# Patient Record
Sex: Male | Born: 1945 | Race: Black or African American | Hispanic: No | Marital: Single | Smoking: Never smoker
Health system: Southern US, Community
[De-identification: ages and names within clinical notes are randomized; demographics above are authoritative.]

## PROBLEM LIST (undated history)

## (undated) DIAGNOSIS — E785 Hyperlipidemia, unspecified: Secondary | ICD-10-CM

## (undated) DIAGNOSIS — K219 Gastro-esophageal reflux disease without esophagitis: Secondary | ICD-10-CM

## (undated) DIAGNOSIS — F329 Major depressive disorder, single episode, unspecified: Secondary | ICD-10-CM

## (undated) DIAGNOSIS — I10 Essential (primary) hypertension: Secondary | ICD-10-CM

## (undated) HISTORY — PX: GASTRECTOMY: SHX58

---

## 2015-04-02 ENCOUNTER — Emergency Department: Payer: Medicare Other

## 2015-04-02 ENCOUNTER — Inpatient Hospital Stay
Admission: EM | Admit: 2015-04-02 | Discharge: 2015-04-04 | DRG: 871 | Disposition: A | Discharge status: Other Acute Inpatient Hospital | Payer: Medicare Other | Attending: Internal Medicine | Admitting: Internal Medicine

## 2015-04-02 DIAGNOSIS — N433 Hydrocele, unspecified: Secondary | ICD-10-CM | POA: Diagnosis present

## 2015-04-02 DIAGNOSIS — F17210 Nicotine dependence, cigarettes, uncomplicated: Secondary | ICD-10-CM | POA: Diagnosis present

## 2015-04-02 DIAGNOSIS — R000 Tachycardia, unspecified: Secondary | ICD-10-CM | POA: Diagnosis present

## 2015-04-02 DIAGNOSIS — G0490 Encephalitis and encephalomyelitis, unspecified: Secondary | ICD-10-CM | POA: Diagnosis present

## 2015-04-02 DIAGNOSIS — I330 Acute and subacute infective endocarditis: Secondary | ICD-10-CM | POA: Diagnosis not present

## 2015-04-02 DIAGNOSIS — F1210 Cannabis abuse, uncomplicated: Secondary | ICD-10-CM | POA: Diagnosis present

## 2015-04-02 DIAGNOSIS — I10 Essential (primary) hypertension: Secondary | ICD-10-CM

## 2015-04-02 DIAGNOSIS — I4510 Unspecified right bundle-branch block: Secondary | ICD-10-CM | POA: Diagnosis present

## 2015-04-02 DIAGNOSIS — R509 Fever, unspecified: Secondary | ICD-10-CM | POA: Diagnosis present

## 2015-04-02 DIAGNOSIS — R4182 Altered mental status, unspecified: Secondary | ICD-10-CM

## 2015-04-02 DIAGNOSIS — R918 Other nonspecific abnormal finding of lung field: Secondary | ICD-10-CM | POA: Diagnosis present

## 2015-04-02 DIAGNOSIS — R739 Hyperglycemia, unspecified: Secondary | ICD-10-CM | POA: Diagnosis present

## 2015-04-02 DIAGNOSIS — G009 Bacterial meningitis, unspecified: Secondary | ICD-10-CM | POA: Diagnosis not present

## 2015-04-02 DIAGNOSIS — I248 Other forms of acute ischemic heart disease: Secondary | ICD-10-CM | POA: Diagnosis present

## 2015-04-02 DIAGNOSIS — I214 Non-ST elevation (NSTEMI) myocardial infarction: Secondary | ICD-10-CM

## 2015-04-02 DIAGNOSIS — A419 Sepsis, unspecified organism: Principal | ICD-10-CM | POA: Diagnosis present

## 2015-04-02 DIAGNOSIS — I639 Cerebral infarction, unspecified: Secondary | ICD-10-CM

## 2015-04-02 DIAGNOSIS — R740 Nonspecific elevation of levels of transaminase and lactic acid dehydrogenase [LDH]: Secondary | ICD-10-CM | POA: Diagnosis present

## 2015-04-02 LAB — ETHANOL: Alcohol, Ethyl (B): <5 mg/dL (ref <5)

## 2015-04-02 LAB — CSF CELL COUNT WITH DIFFERENTIAL
Lymphs, CSF: 6 % — ABNORMAL LOW (ref 40–80)
Lymphs, CSF: 9 % — ABNORMAL LOW (ref 40–80)
Monocyte-Macrophage-Spinal Fluid: 5 % — ABNORMAL LOW (ref 15–45)
Monocyte-Macrophage-Spinal Fluid: 8 % — ABNORMAL LOW (ref 15–45)
RBC COUNT CSF: 830 /mm3 — AB
RBC Count, CSF: 234 /mm3 — ABNORMAL HIGH
SEGMENTED NEUTROPHILS-CSF: 86 % — AB (ref 0–6)
Segmented Neutrophils-CSF: 86 % — ABNORMAL HIGH (ref 0–6)
TUBE #: 1
TUBE #: 1
WBC, CSF: 4541 /mm3 (ref 0–5)
WBC, CSF: 5693 /mm3 (ref 0–5)

## 2015-04-02 LAB — COMPREHENSIVE METABOLIC PANEL
ALBUMIN: 3.4 g/dL — AB (ref 3.5–5.0)
ALK PHOS: 81 U/L (ref 38–126)
ALT: 40 U/L (ref 17–63)
AST: 202 U/L — AB (ref 15–41)
Anion gap: 13 (ref 5–15)
BILIRUBIN TOTAL: 0.5 mg/dL (ref 0.3–1.2)
BUN: 22 mg/dL — ABNORMAL HIGH (ref 6–20)
CO2: 25 mmol/L (ref 22–32)
Calcium: 9.5 mg/dL (ref 8.9–10.3)
Chloride: 100 mmol/L — ABNORMAL LOW (ref 101–111)
Creatinine, Ser: 1.2 mg/dL (ref 0.61–1.24)
GFR calc Af Amer: >60 mL/min (ref >60)
GFR calc non Af Amer: 60 mL/min — ABNORMAL LOW (ref >60)
Glucose, Bld: 162 mg/dL — ABNORMAL HIGH (ref 65–99)
POTASSIUM: 3.6 mmol/L (ref 3.5–5.1)
Sodium: 138 mmol/L (ref 135–145)
Total Protein: 9.2 g/dL — ABNORMAL HIGH (ref 6.5–8.1)

## 2015-04-02 LAB — CBC WITH DIFFERENTIAL/PLATELET
Basophils Absolute: 0 10*3/uL (ref 0–0.1)
Basophils Relative: 0 %
Eosinophils Absolute: 0 10*3/uL (ref 0–0.7)
Eosinophils Relative: 0 %
HCT: 43.6 % (ref 40.0–52.0)
Hemoglobin: 14.3 g/dL (ref 13.0–18.0)
LYMPHS ABS: 0.9 10*3/uL — AB (ref 1.0–3.6)
Lymphocytes Relative: 3 %
MCH: 29 pg (ref 26.0–34.0)
MCHC: 32.7 g/dL (ref 32.0–36.0)
MCV: 88.8 fL (ref 80.0–100.0)
Monocytes Absolute: 1.5 10*3/uL — ABNORMAL HIGH (ref 0.2–1.0)
Monocytes Relative: 6 %
NEUTROS ABS: 23.8 10*3/uL — AB (ref 1.4–6.5)
NEUTROS PCT: 91 %
PLATELETS: 281 10*3/uL (ref 150–440)
RBC: 4.91 MIL/uL (ref 4.40–5.90)
RDW: 14.9 % — ABNORMAL HIGH (ref 11.5–14.5)
WBC: 26.2 10*3/uL — ABNORMAL HIGH (ref 3.8–10.6)

## 2015-04-02 LAB — RAPID HIV SCREEN (HIV 1/2 AB+AG)
HIV 1/2 ANTIBODIES: NONREACTIVE
HIV-1 P24 ANTIGEN - HIV24: NONREACTIVE

## 2015-04-02 LAB — URINE DRUG SCREEN, QUALITATIVE (ARMC ONLY)
AMPHETAMINES, UR SCREEN: NOT DETECTED
BARBITURATES, UR SCREEN: NOT DETECTED
BENZODIAZEPINE, UR SCRN: NOT DETECTED
Cannabinoid 50 Ng, Ur ~~LOC~~: POSITIVE — AB
Cocaine Metabolite,Ur ~~LOC~~: NOT DETECTED
MDMA (Ecstasy)Ur Screen: NOT DETECTED
Methadone Scn, Ur: NOT DETECTED
OPIATE, UR SCREEN: NOT DETECTED
Phencyclidine (PCP) Ur S: NOT DETECTED
Tricyclic, Ur Screen: NOT DETECTED

## 2015-04-02 LAB — TROPONIN I
Troponin I: 0.28 ng/mL — ABNORMAL HIGH (ref <0.031)
Troponin I: 0.36 ng/mL — ABNORMAL HIGH (ref <0.031)
Troponin I: 0.52 ng/mL — ABNORMAL HIGH (ref <0.031)

## 2015-04-02 LAB — MRSA PCR SCREENING: MRSA by PCR: NEGATIVE

## 2015-04-02 LAB — URINALYSIS COMPLETE WITH MICROSCOPIC (ARMC ONLY)
Bacteria, UA: NONE SEEN
Bilirubin Urine: NEGATIVE
Glucose, UA: 50 mg/dL — AB
Leukocytes, UA: NEGATIVE
Nitrite: NEGATIVE
Specific Gravity, Urine: 1.025 (ref 1.005–1.030)
pH: 5 (ref 5.0–8.0)

## 2015-04-02 LAB — PROTIME-INR
INR: 1.2
Prothrombin Time: 15.4 seconds — ABNORMAL HIGH (ref 11.4–15.0)

## 2015-04-02 LAB — SEDIMENTATION RATE: Sed Rate: 28 mm/hr — ABNORMAL HIGH (ref 0–20)

## 2015-04-02 LAB — LACTIC ACID, PLASMA: LACTIC ACID, VENOUS: 1.6 mmol/L (ref 0.5–2.0)

## 2015-04-02 LAB — GLUCOSE, CAPILLARY
Glucose-Capillary: 162 mg/dL — ABNORMAL HIGH (ref 65–99)
Glucose-Capillary: 203 mg/dL — ABNORMAL HIGH (ref 65–99)

## 2015-04-02 LAB — PROTEIN, CSF: Total  Protein, CSF: 269 mg/dL — ABNORMAL HIGH (ref 15–45)

## 2015-04-02 LAB — GLUCOSE, CSF: Glucose, CSF: <20 mg/dL — CL (ref 40–70)

## 2015-04-02 LAB — PHOSPHORUS: Phosphorus: 2.3 mg/dL — ABNORMAL LOW (ref 2.5–4.6)

## 2015-04-02 LAB — ACETAMINOPHEN LEVEL: Acetaminophen (Tylenol), Serum: <10 ug/mL — ABNORMAL LOW (ref 10–30)

## 2015-04-02 LAB — SALICYLATE LEVEL

## 2015-04-02 LAB — TSH: TSH: 1.793 u[IU]/mL (ref 0.350–4.500)

## 2015-04-02 LAB — LACTATE DEHYDROGENASE: LDH: 604 U/L — ABNORMAL HIGH (ref 98–192)

## 2015-04-02 LAB — MAGNESIUM: Magnesium: 2.2 mg/dL (ref 1.7–2.4)

## 2015-04-02 MED ORDER — HYDRALAZINE HCL 20 MG/ML IJ SOLN: 10.0000 mg | INTRAMUSCULAR | Status: DC | PRN

## 2015-04-02 MED ORDER — DILTIAZEM HCL 25 MG/5ML IV SOLN
INTRAVENOUS | Status: AC
  Administered 2015-04-02: 10 mg via INTRAVENOUS
  Filled 2015-04-02: qty 5

## 2015-04-02 MED ORDER — ACETAMINOPHEN 325 MG PO TABS: 650.0000 mg | ORAL_TABLET | Freq: Four times a day (QID) | ORAL | Status: DC | PRN

## 2015-04-02 MED ORDER — FOLIC ACID 5 MG/ML IJ SOLN
1.0000 mg | Freq: Every day | INTRAMUSCULAR | Status: DC
Start: 2015-04-02 — End: 2015-04-04
  Administered 2015-04-02 – 2015-04-04 (×3): 1 mg via INTRAVENOUS
  Filled 2015-04-02 (×4): qty 0.2

## 2015-04-02 MED ORDER — NICARDIPINE HCL IN NACL 20-0.86 MG/200ML-% IV SOLN
3.0000 mg/h | Freq: Once | INTRAVENOUS | Status: AC
  Administered 2015-04-02: 5 mg/h via INTRAVENOUS
  Filled 2015-04-02: qty 200

## 2015-04-02 MED ORDER — DILTIAZEM HCL 100 MG IV SOLR
5.0000 mg/h | Freq: Once | INTRAVENOUS | Status: AC
  Administered 2015-04-02: 5 mg/h via INTRAVENOUS
  Filled 2015-04-02: qty 100

## 2015-04-02 MED ORDER — DILTIAZEM HCL 25 MG/5ML IV SOLN
10.0000 mg | Freq: Once | INTRAVENOUS | Status: AC
Start: 2015-04-02 — End: 2015-04-02
  Administered 2015-04-02: 10 mg via INTRAVENOUS

## 2015-04-02 MED ORDER — VANCOMYCIN HCL IN DEXTROSE 1-5 GM/200ML-% IV SOLN
1000.0000 mg | Freq: Two times a day (BID) | INTRAVENOUS | Status: DC
  Administered 2015-04-03 – 2015-04-04 (×2): 1000 mg via INTRAVENOUS
  Filled 2015-04-02 (×4): qty 200

## 2015-04-02 MED ORDER — PNEUMOCOCCAL VAC POLYVALENT 25 MCG/0.5ML IJ INJ
0.5000 mL | INJECTION | INTRAMUSCULAR | Status: AC
  Administered 2015-04-03: 0.5 mL via INTRAMUSCULAR
  Filled 2015-04-02: qty 0.5

## 2015-04-02 MED ORDER — THIAMINE HCL 100 MG/ML IJ SOLN
100.0000 mg | Freq: Every day | INTRAMUSCULAR | Status: DC
  Administered 2015-04-02 – 2015-04-04 (×3): 100 mg via INTRAVENOUS
  Filled 2015-04-02 (×3): qty 2

## 2015-04-02 MED ORDER — SODIUM CHLORIDE 0.9 % IV BOLUS (SEPSIS)
1000.0000 mL | Freq: Once | INTRAVENOUS | Status: AC
  Administered 2015-04-02: 1000 mL via INTRAVENOUS

## 2015-04-02 MED ORDER — ACETAMINOPHEN 650 MG RE SUPP
RECTAL | Status: AC
  Filled 2015-04-02: qty 1

## 2015-04-02 MED ORDER — HYDRALAZINE HCL 20 MG/ML IJ SOLN
10.0000 mg | Freq: Once | INTRAMUSCULAR | Status: AC
  Administered 2015-04-02: 10 mg via INTRAVENOUS
  Filled 2015-04-02: qty 1

## 2015-04-02 MED ORDER — ASPIRIN 300 MG RE SUPP
300.0000 mg | Freq: Once | RECTAL | Status: AC
  Administered 2015-04-02: 300 mg via RECTAL
  Filled 2015-04-02: qty 1

## 2015-04-02 MED ORDER — DEXTROSE 5 % IV SOLN
10.0000 mg/kg | Freq: Once | INTRAVENOUS | Status: AC
  Administered 2015-04-02: 860 mg via INTRAVENOUS
  Filled 2015-04-02: qty 17.2

## 2015-04-02 MED ORDER — HEPARIN SODIUM (PORCINE) 5000 UNIT/ML IJ SOLN
5000.0000 [IU] | Freq: Three times a day (TID) | INTRAMUSCULAR | Status: DC
  Administered 2015-04-02 (×2): 5000 [IU] via SUBCUTANEOUS
  Filled 2015-04-02 (×2): qty 1

## 2015-04-02 MED ORDER — DEXTROSE 5 % IV SOLN
2.0000 g | Freq: Three times a day (TID) | INTRAVENOUS | Status: DC
  Administered 2015-04-02 – 2015-04-04 (×7): 2 g via INTRAVENOUS
  Filled 2015-04-02 (×10): qty 2

## 2015-04-02 MED ORDER — LORAZEPAM 2 MG/ML IJ SOLN
INTRAMUSCULAR | Status: AC
  Filled 2015-04-02: qty 1

## 2015-04-02 MED ORDER — LORAZEPAM 2 MG/ML IJ SOLN
INTRAMUSCULAR | Status: AC
  Administered 2015-04-02: 1 mg via INTRAVENOUS
  Filled 2015-04-02: qty 1

## 2015-04-02 MED ORDER — HEPARIN (PORCINE) IN NACL 100-0.45 UNIT/ML-% IJ SOLN
1350.0000 [IU]/h | INTRAMUSCULAR | Status: DC
  Administered 2015-04-02: 1100 [IU]/h via INTRAVENOUS
  Filled 2015-04-02 (×4): qty 250

## 2015-04-02 MED ORDER — VANCOMYCIN HCL IN DEXTROSE 1-5 GM/200ML-% IV SOLN
1000.0000 mg | Freq: Once | INTRAVENOUS | Status: AC
  Administered 2015-04-02: 1000 mg via INTRAVENOUS
  Filled 2015-04-02: qty 200

## 2015-04-02 MED ORDER — LORAZEPAM 2 MG/ML IJ SOLN
1.0000 mg | Freq: Once | INTRAMUSCULAR | Status: AC
  Administered 2015-04-02: 1 mg via INTRAVENOUS

## 2015-04-02 MED ORDER — DEXTROSE 5 % IV SOLN
850.0000 mg | Freq: Three times a day (TID) | INTRAVENOUS | Status: DC
  Administered 2015-04-02 – 2015-04-04 (×6): 850 mg via INTRAVENOUS
  Filled 2015-04-02 (×10): qty 17

## 2015-04-02 MED ORDER — ACETAMINOPHEN 650 MG RE SUPP
650.0000 mg | Freq: Four times a day (QID) | RECTAL | Status: DC | PRN
  Administered 2015-04-03: 650 mg via RECTAL
  Filled 2015-04-02: qty 1

## 2015-04-02 MED ORDER — IOHEXOL 300 MG/ML  SOLN
100.0000 mL | Freq: Once | INTRAMUSCULAR | Status: AC | PRN
  Administered 2015-04-02: 100 mL via INTRAVENOUS

## 2015-04-02 MED ORDER — NICARDIPINE HCL IN NACL 20-0.86 MG/200ML-% IV SOLN
3.0000 mg/h | INTRAVENOUS | Status: DC
  Administered 2015-04-02: 15 mg/h via INTRAVENOUS
  Administered 2015-04-02: 7.5 mg/h via INTRAVENOUS
  Administered 2015-04-02 (×2): 15 mg/h via INTRAVENOUS
  Administered 2015-04-03 (×3): 8 mg/h via INTRAVENOUS
  Administered 2015-04-03: 6 mg/h via INTRAVENOUS
  Administered 2015-04-03 (×3): 8 mg/h via INTRAVENOUS
  Administered 2015-04-03: 10 mg/h via INTRAVENOUS
  Administered 2015-04-04: 3 mg/h via INTRAVENOUS
  Filled 2015-04-02 (×14): qty 200

## 2015-04-02 MED ORDER — ACETAMINOPHEN 650 MG RE SUPP
650.0000 mg | Freq: Once | RECTAL | Status: AC
  Administered 2015-04-02: 650 mg via RECTAL

## 2015-04-02 MED ORDER — VANCOMYCIN HCL IN DEXTROSE 1-5 GM/200ML-% IV SOLN
1000.0000 mg | Freq: Two times a day (BID) | INTRAVENOUS | Status: DC
  Filled 2015-04-02: qty 200

## 2015-04-02 MED ORDER — ADENOSINE 6 MG/2ML IV SOLN
INTRAVENOUS | Status: AC
  Administered 2015-04-02: 6 mg
  Filled 2015-04-02: qty 2

## 2015-04-02 MED ORDER — SODIUM CHLORIDE 0.9 % IV BOLUS (SEPSIS)
1580.0000 mL | Freq: Once | INTRAVENOUS | Status: AC
  Administered 2015-04-02: 1580 mL via INTRAVENOUS

## 2015-04-02 NOTE — ED Notes (Signed)
Pt via EMS found by brother this am with AMS. Last time known well last Tuesday. Usually A&Ox4 per EMS report. Pt uncooperative at this time. Agitated. Edd Fabian MD at bedside.

## 2015-04-02 NOTE — ED Provider Notes (Signed)
[facility] Emergency Department Provider Note  ____________________________________________  Time seen: Approximately 14:45 AM  I have reviewed the triage vital signs and the nursing notes.   HISTORY  Chief Complaint Altered Mental Status  Caveat-history of present illness and review of systems Limited secondary to the patient's altered mental status. All history of present illness and review of systems obtained from EMS on their arrival.  HPI Nathan Thomas is a 69 y.o. male with unknown medical history presents via EMS for altered mental status. According to the patient's family, they last saw him well 4 days ago. Today, his brother found him altered at his home. On EMS arrival, he was severely hypertensive but vital signs were otherwise within normal limits. No other history is provided.    History reviewed. No pertinent past medical history.  Patient Active Problem List   Diagnosis Date Noted  . Fever 04/02/2015    History reviewed. No pertinent past surgical history.  No current outpatient prescriptions on file.  Allergies Review of patient's allergies indicates no known allergies.  History reviewed. No pertinent family history.  Social History History  Substance Use Topics  . Smoking status: Not on file  . Smokeless tobacco: Not on file  . Alcohol Use: Not on file    Review of Systems   Caveat-history of present illness and review of systems Limited secondary to the patient's altered mental status. All history of present illness and review of systems obtained from EMS on their arrival. ____________________________________________   PHYSICAL EXAM:  VITAL SIGNS: ED Triage Vitals  Enc Vitals Group     BP 04/02/15 1052 198/101 mmHg     Pulse Rate 04/02/15 1052 91     Resp 04/02/15 1052 24     Temp 04/02/15 1052 102.1 F (38.9 C)     Temp Source 04/02/15 1052 Rectal     SpO2 04/02/15 1052 93 %     Weight 04/02/15 1052 190 lb (86.183  kg)     Height 04/02/15 1052 5\' 11"  (1.803 m)     Head Cir --      Peak Flow --      Pain Score --      Pain Loc --      Pain Edu? --      Excl. in [place]? --     Constitutional: The patient is awake, severely agitated, moving all of his extremities vigorously, fighting the examiner Eyes: Conjunctivae are normal. PERRL. EOMI. Head: Atraumatic. Nose: No congestion/rhinnorhea. Mouth/Throat: Mucous membranes are dry.  Oropharynx non-erythematous. Neck: No stridor.  Cardiovascular: Normal rate, regular rhythm. Grossly normal heart sounds.  Good peripheral circulation. Respiratory: Normal respiratory effort.  No retractions. Lungs CTAB. Gastrointestinal: Soft and nontender. No distention. No abdominal bruits. No CVA tenderness. Genitourinary: large scrotal swelling without perceived tenderness Musculoskeletal: No lower extremity tenderness nor edema.  No joint effusions. Neurologic: Eyes open spontaneously, mumbles incoherently, moves all extremities equally. Unable to cooperate with formal neurological examination. Skin:  Skin is warm, dry and intact. No rash noted. Psychiatric: Unable to assess secondary to altered mental status.  ____________________________________________   LABS (all labs ordered are listed, but only abnormal results are displayed)  Labs Reviewed  CBC WITH DIFFERENTIAL/PLATELET - Abnormal; Notable for the following:    WBC 26.2 (*)    RDW 14.9 (*)    Neutro Abs 23.8 (*)    Lymphs Abs 0.9 (*)    Monocytes Absolute 1.5 (*)    All other components within normal  limits  COMPREHENSIVE METABOLIC PANEL - Abnormal; Notable for the following:    Chloride 100 (*)    Glucose, Bld 162 (*)    BUN 22 (*)    Total Protein 9.2 (*)    Albumin 3.4 (*)    AST 202 (*)    GFR calc non Af Amer 60 (*)    All other components within normal limits  TROPONIN I - Abnormal; Notable for the following:    Troponin I 0.28 (*)    All other components within normal limits  URINALYSIS  COMPLETEWITH MICROSCOPIC ([facility] ONLY) - Abnormal; Notable for the following:    Color, Urine YELLOW (*)    APPearance CLEAR (*)    Glucose, UA 50 (*)    Ketones, ur TRACE (*)    Hgb urine dipstick 3+ (*)    Protein, ur >500 (*)    Squamous Epithelial / LPF 0-5 (*)    All other components within normal limits  URINE DRUG SCREEN, QUALITATIVE ([facility] ONLY) - Abnormal; Notable for the following:    Cannabinoid 50 Ng, Ur [place] POSITIVE (*)    All other components within normal limits  ACETAMINOPHEN LEVEL - Abnormal; Notable for the following:    Acetaminophen (Tylenol), Serum <10 (*)    All other components within normal limits  CULTURE, BLOOD (ROUTINE X 2)  CULTURE, BLOOD (ROUTINE X 2)  LACTIC ACID, PLASMA  ETHANOL  SALICYLATE LEVEL  LACTIC ACID, PLASMA   ____________________________________________  EKG  ED ECG REPORT I, Joanne Gavel, the attending physician, personally viewed and interpreted this ECG.   Date: 04/02/2015  EKG Time: 10:50  Rate: 75  Rhythm: normal sinus rhythm  Axis: normal  Intervals:right bundle branch block  ST&T Change: No acute ST segment elevation.  ____________________________________________  RADIOLOGY  CT head IMPRESSION: 1. No acute intracranial abnormalities. 2. Age related volume loss. 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    CXR IMPRESSION: 1. 4.2 cm masslike opacity in the right mid lung. This is concerning for malignancy. 2. Coarse reticular and patchy airspace opacity noted in the right upper lobe. This could reflect infection. 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 3. Recommend follow-up chest CT with contrast.  CT chest/abdomen and pelvis IMPRESSION: 1. 4 cm right upper lobe mass highly suspicious for a primary bronchogenic carcinoma. Irregular mass like opacity seen adjacent to this in the right upper lobe which may reflect conglomerate scarring, additional tumor or combination of both. Other areas of right upper lobe opacity  are consistent with chronic scarring. There is an emphysematous bleb at the right apex. 2. No evidence of hilar or mediastinal metastatic disease or of left lung metastatic disease. 3. In the abdomen pelvis, the bladder wall appears thickened. Consider cystitis if there are consistent clinical symptoms. This could be a chronic finding in this patient. 4. No other evidence of an acute abnormality below the diaphragm. 5. 4 mm low-density lesion in the central liver. This may reflect a small cyst. Metastatic disease is not excluded but felt unlikely. Adjacent lower attenuation liver lesion appears to contain some fat. No other evidence to suggest metastatic disease within the abdomen or pelvis. ADDENDUM: Patient also has a distended scrotum which appears to be due to large bilateral hydroceles. This could be further assessed, if desired clinically, with scrotal ultrasound. No scrotal air is seen to suggest a gas-forming infection.  ____________________________________________   PROCEDURES  Procedure(s) performed: None  Critical Care performed: Yes, see critical care note(s). Total critical care time spent  50 minutes.  ____________________________________________   INITIAL IMPRESSION / ASSESSMENT AND PLAN / ED COURSE  Pertinent labs & imaging results that were available during my care of the patient were reviewed by me and considered in my medical decision making (see chart for details).  Nathan Thomas is a 69 y.o. male with unknown medical history presents via EMS for altered mental status. On arrival to the emergency department, he was agitated, attempting to get out of bed, fighting the examiner which improved significantly with 1 mg of Ativan. He is febrile, tachypneic meeting 2 out of 4 Sirs criteria. Plan for labs, CT head, chest x-ray, urinalysis, CT of the pelvis to rule out Fournier's gangrene. We'll treat with liberal IV fluids, IV vancomycin, IV Ceftazidime. Anticipate  admission.  ----------------------------------------- 3:15 PM on 04/02/2015 ----------------------------------------- Labs reviewed by me and are notable for severe leukocytosis, troponin elevation. Aspirin ordered. urine drug screen positive for cannabis. CT head negative. CT of the chest abdomen pelvis performed, notable for concerning pulmonary lesion which may represent malignancy. Scrotum with bilateral hydroceles, no evidence of Fournier's gangrene. No obvious source for the patient's fever/altered mental status at this time. I attempted lumbar puncture without success. Discussed with Dr. Sanda Linger of interventional radiology and he will help to arrange lumbar puncture under fluoroscopy. We will give IV acyclovir as well. Case discussed with the hospitalist, Dr. Volanda Napoleon, at this time for admission. Blood pressure with only transient response to hydralazine which I have given him twice. We'll start nicardipine drip.  ____________________________________________   FINAL CLINICAL IMPRESSION(S) / ED DIAGNOSES  Final diagnoses:  Altered mental status  Sepsis, due to unspecified organism  Malignant hypertension  NSTEMI (non-ST elevated myocardial infarction)      Joanne Gavel, MD 04/02/15 1554

## 2015-04-02 NOTE — Progress Notes (Signed)
Called by nursing staff for patient with tachycardia, evaluated patient at bedside Heart rate in the 160s appears narrow complex tachycardia (sinus tachycardia) Of note patient is now on maximum dose of nicardipine  Likely nicardipine-induced tachycardia  The 20-25% reduction original blood pressure place close systolic pressure around 585, can wean nicardipine to this new goal blood pressure and monitor effect heart rate, if required can use hydralazine to augment blood pressure control

## 2015-04-02 NOTE — Progress Notes (Signed)
ANTIBIOTIC CONSULT NOTE - INITIAL  Pharmacy Consult for Acyclovir and Vancomycin Dosing  Indication: Fever/Leukocytosis/Possible Meningitis  No Known Allergies  Patient Measurements: Height: 5\' 11"  (180.3 cm) Weight: 190 lb (86.183 kg) IBW/kg (Calculated) : 75.3 Adjusted Body Weight: 80kg  Vital Signs: Temp: 99.4 F (37.4 C) (07/16 1730) Temp Source: Rectal (07/16 1118) BP: 179/98 mmHg (07/16 1740) Pulse Rate: 103 (07/16 1740) Intake/Output from previous day:   Intake/Output from this shift: Total I/O In: -  Out: 1200 [Urine:1200]  Labs:  Recent Labs  04/02/15 1105  WBC 26.2*  HGB 14.3  PLT 281  CREATININE 1.20   Estimated Creatinine Clearance: 61.9 mL/min (by C-G formula based on Cr of 1.2). No results for input(s): VANCOTROUGH, VANCOPEAK, VANCORANDOM, GENTTROUGH, GENTPEAK, GENTRANDOM, TOBRATROUGH, TOBRAPEAK, TOBRARND, AMIKACINPEAK, AMIKACINTROU, AMIKACIN in the last 72 hours.   Microbiology: No results found for this or any previous visit (from the past 720 hour(s)).  Medical History: History reviewed. No pertinent past medical history.  Medications:  Scheduled:  . acyclovir  850 mg Intravenous 3 times per day  . cefTAZidime (FORTAZ)  IV  2 g Intravenous 3 times per day  . folic acid  1 mg Intravenous Daily  . heparin  5,000 Units Subcutaneous 3 times per day  . thiamine  100 mg Intravenous Daily  . [START ON 04/03/2015] vancomycin  1,000 mg Intravenous Q12H   Infusions:  . niCARDipine 12.5 mg/hr (04/02/15 1802)   Assessment: Pharmacy consulted to dose vancomycin and acyclovir for 69 yo male being treated for possible meningitis, admitted with fever and leukocytosis. Patient was found unresponsive. Patient is ordered ceftazadime 2g IV Q8hr and received vancomycin and acyclovir in ED on 7/16.    Goal of Therapy:  Vancomycin trough level 15-20 mcg/ml  Plan:   1. Acyclovir: Will continue patient on acyclovir 850mg (~10mg /kg) IV Q8hr.   2. Vancomycin:  Will continue patient on vancomycin 1g IV q12hr for goal trough of 15-20. Will start next dose at 1830. Will obtain trough prior to am dose on 7/18.    Pharmacy will continue to monitor and adjust per consult.   Simpson,Michael L 04/02/2015,6:03 PM

## 2015-04-02 NOTE — H&P (Signed)
[facility] [place] NAME: Nathan Thomas    MR#:  [id number]  DATE OF BIRTH:  November 26, 1945  DATE OF ADMISSION:  04/02/2015  PRIMARY CARE PHYSICIAN: No PCP Per Patient   REQUESTING/REFERRING PHYSICIAN: Dr Edd Fabian  CHIEF COMPLAINT:   Chief Complaint  Patient presents with  . Altered Mental Status    HISTORY OF PRESENT ILLNESS:  Nathan Thomas  is a 69 y.o. male with no significant past medical history who presented to the emergency room this morning unresponsive. Per the EMS report he was found by his brother unresponsive at his home. Last known well last Tuesday, 5 days ago. At baseline he is alert and oriented 4. When brought in by EMS he was disoriented and uncooperative. At the time of my examination he has had 2 mg of Ativan and is minimally responsive he is febrile at 102, with leukocytosis at 26,000 and hypertensive with a blood pressure of 210/94.  PAST MEDICAL HISTORY:  History reviewed. No pertinent past medical history. no known past medical history, unable to obtain from patient due to altered mental status. Per EMS report he does not get better and treat himself with homeopathic remedies.  PAST SURGICAL HISTORY:  History reviewed. No pertinent past surgical history. unknown  SOCIAL HISTORY:   History  Substance Use Topics  . Smoking status: Not on file  . Smokeless tobacco: Not on file  . Alcohol Use: Not on file   Unknown. I have attempted to call his brother, no answer.  FAMILY HISTORY:  History reviewed. No pertinent family history.   Unknown, unable to obtain due to patient's altered mental status  DRUG ALLERGIES:  No Known Allergies  REVIEW OF SYSTEMS:   ROS   Unable to obtain due to patient's altered mental status  MEDICATIONS AT HOME:   Prior to Admission medications   Not on File      VITAL SIGNS:  Blood pressure 210/94, pulse 78, temperature 102.2 F (39 C), temperature source Rectal, resp.  rate 25, height $RemoveBe'5\' 11"'rggHblJCQ$  (1.803 m), weight 86.183 kg (190 lb), SpO2 97 %.  PHYSICAL EXAMINATION:  GENERAL:  69 y.o.-year-old patient lying in the bed, head turned to the left, minimally responsive, no acute distress EYES: Pupils equal, round, reactive to light and accommodation. No scleral icterus. Eyes track together, mainly fixed in a downward gaze HEENT: Head atraumatic, normocephalic. Will not open his mouth, jaw as forcefully closed  NECK:  Supple, no jugular venous distention. No thyroid enlargement, no tenderness.  LUNGS: Normal breath sounds bilaterally, no wheezing, rales, rhonchi or crepitation. No use of accessory muscles of respiration.  CARDIOVASCULAR: S1, S2 normal. No murmurs, rubs, or gallops.  ABDOMEN: Soft, nontender, nondistended. Bowel sounds present. No organomegaly or mass. No guarding no rebound EXTREMITIES: No pedal edema, cyanosis, or clubbing. Pedal pulses are 2+, good capillary refill, NEUROLOGIC: Does not participate in neurologic examination, moves both upper extremities spontaneously, eyes do track together that are mainly fixed in a downward gaze, moves his head spontaneously back and forth, withdraws from pain in all 4 extremities PSYCHIATRIC: Unable to assess due to altered mental status SKIN: No obvious rash, lesion, or ulcer.   LABORATORY PANEL:   CBC  Recent Labs Lab 04/02/15 1105  WBC 26.2*  HGB 14.3  HCT 43.6  PLT 281   ------------------------------------------------------------------------------------------------------------------  Chemistries   Recent Labs Lab 04/02/15 1105  NA 138  K 3.6  CL 100*  CO2 25  GLUCOSE 162*  BUN 22*  CREATININE 1.20  CALCIUM 9.5  AST 202*  ALT 40  ALKPHOS 81  BILITOT 0.5   ------------------------------------------------------------------------------------------------------------------  Cardiac Enzymes  Recent Labs Lab 04/02/15 1105  TROPONINI 0.28*    ------------------------------------------------------------------------------------------------------------------  RADIOLOGY:  Ct Head Wo Contrast  04/02/2015   CLINICAL DATA:  Pt via EMS found by brother this am with AMS. Last time known well last Tuesday. Usually A&Ox4 per EMS report. Pt uncooperative at this time. Agitated.  EXAM: CT HEAD WITHOUT CONTRAST  TECHNIQUE: Contiguous axial images were obtained from the base of the skull through the vertex without intravenous contrast.  COMPARISON:  None  FINDINGS: Ventricles are normal in configuration. There is mild, age related, ventricular and sulcal enlargement. No hydrocephalus.  There are no parenchymal masses or mass effect. There is no evidence of a recent infarct. Small area of hypoattenuation the left cerebellum consistent with an old infarct.  There are no extra-axial masses or abnormal fluid collections.  There is no intracranial hemorrhage.  Visualized sinuses and mastoid air cells are clear. No skull fracture or lesion.  IMPRESSION: 1. No acute intracranial abnormalities. 2. Age related volume loss.  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   Electronically Signed   By: Lajean Manes M.D.   On: 04/02/2015 11:55   Ct Chest W Contrast  04/02/2015   ADDENDUM REPORT: 04/02/2015 14:25  ADDENDUM: Patient also has a distended scrotum which appears to be due to large bilateral hydroceles. This could be further assessed, if desired clinically, with scrotal ultrasound. No scrotal air is seen to suggest a gas-forming infection.   Electronically Signed   By: Lajean Manes M.D.   On: 04/02/2015 14:25   04/02/2015   CLINICAL DATA:  Pt via EMS found by brother this am with AMS. Last time known well last Tuesday. Usually A&Ox4 per EMS report. Pt with abnormal chest x-ray from today and scrotal swelling noted on physical exam. Pt unable to answer any med hx questions  EXAM: CT CHEST, ABDOMEN, AND PELVIS WITH CONTRAST  TECHNIQUE: Multidetector CT imaging of the  chest, abdomen and pelvis was performed following the standard protocol during bolus administration of intravenous contrast.  CONTRAST:  141mL OMNIPAQUE IOHEXOL 300 MG/ML  SOLN  COMPARISON:  Current chest radiograph  FINDINGS: CT CHEST FINDINGS  Lungs and pleura: 4 cm mass in the right upper lobe correlates with the oval masslike opacity noted on the current chest radiograph. There is also a right upper lobe irregular patchy opacity in coarse reticular opacity. The areas of contiguous regular patchy opacity, which extend from the mass to the superior lateral right upper lobe pleural margin, may reflect additional tumor, conglomerate scarring or a combination. There also cystic areas and bronchiectasis with a prominent bleb at the right apex. Much of the coarse reticular opacities are likely due scarring. There are changes of mild centrilobular emphysema in both lungs mostly in the upper lobes. There is some dependent subsegmental atelectasis in both lower lobes. No left lung mass or suspicious nodule. No pleural effusions or pneumothorax.  Thoracic inlet: Mildly enlarged heterogeneous thyroid gland. No discrete measurable nodule. No neck base masses or adenopathy.  Mediastinum and hila: No masses or pathologically enlarged lymph nodes. Subcarinal node measures 1 cm short axis. There are several sub cm peritracheal nodes. Heart is normal in size. Great vessels are normal in caliber.  CT ABDOMEN AND PELVIS FINDINGS  Liver: 4 mm low-density lesion centrally. 5 mm low-density lesion, suggesting a fatty lesion, is seen in  the left lobe. Focal fat projects along the inferior falciform ligament. No other liver abnormalities.  Spleen, gallbladder, pancreas:  Unremarkable.  Adrenal glands. Left adrenal gland is thickened, measuring 17 mm in greatest transverse thickness. Normal right adrenal gland.  Kidneys, ureters, bladder: 5 mm low-density lesion from the lower pole left kidney consistent with a cyst. No other renal  masses, no stones and no hydronephrosis. Normal ureters. Bladder is decompressed around a Foley catheter. Despite being collapse, the wall still appears thickened. No bladder mass.  Lymph nodes:  No enlarged or abnormal appearing lymph nodes.  Ascites: None.  Gastrointestinal: No bowel wall thickening or inflammatory changes. No bowel dilation is seen to suggest obstruction or adynamic ileus. No appendicitis.  MUSCULOSKELETAL  No osteoblastic or osteolytic lesions.  IMPRESSION: 1. 4 cm right upper lobe mass highly suspicious for a primary bronchogenic carcinoma. Irregular mass like opacity seen adjacent to this in the right upper lobe which may reflect conglomerate scarring, additional tumor or combination of both. Other areas of right upper lobe opacity are consistent with chronic scarring. There is an emphysematous bleb at the right apex. 2. No evidence of hilar or mediastinal metastatic disease or of left lung metastatic disease. 3. In the abdomen pelvis, the bladder wall appears thickened. Consider cystitis if there are consistent clinical symptoms. This could be a chronic finding in this patient. 4. No other evidence of an acute abnormality below the diaphragm. 5. 4 mm low-density lesion in the central liver. This may reflect a small cyst. Metastatic disease is not excluded but felt unlikely. Adjacent lower attenuation liver lesion appears to contain some fat. No other evidence to suggest metastatic disease within the abdomen or pelvis.  Electronically Signed: By: Lajean Manes M.D. On: 04/02/2015 13:44   Ct Abdomen Pelvis W Contrast  04/02/2015   ADDENDUM REPORT: 04/02/2015 14:25  ADDENDUM: Patient also has a distended scrotum which appears to be due to large bilateral hydroceles. This could be further assessed, if desired clinically, with scrotal ultrasound. No scrotal air is seen to suggest a gas-forming infection.   Electronically Signed   By: Lajean Manes M.D.   On: 04/02/2015 14:25   04/02/2015    CLINICAL DATA:  Pt via EMS found by brother this am with AMS. Last time known well last Tuesday. Usually A&Ox4 per EMS report. Pt with abnormal chest x-ray from today and scrotal swelling noted on physical exam. Pt unable to answer any med hx questions  EXAM: CT CHEST, ABDOMEN, AND PELVIS WITH CONTRAST  TECHNIQUE: Multidetector CT imaging of the chest, abdomen and pelvis was performed following the standard protocol during bolus administration of intravenous contrast.  CONTRAST:  18mL OMNIPAQUE IOHEXOL 300 MG/ML  SOLN  COMPARISON:  Current chest radiograph  FINDINGS: CT CHEST FINDINGS  Lungs and pleura: 4 cm mass in the right upper lobe correlates with the oval masslike opacity noted on the current chest radiograph. There is also a right upper lobe irregular patchy opacity in coarse reticular opacity. The areas of contiguous regular patchy opacity, which extend from the mass to the superior lateral right upper lobe pleural margin, may reflect additional tumor, conglomerate scarring or a combination. There also cystic areas and bronchiectasis with a prominent bleb at the right apex. Much of the coarse reticular opacities are likely due scarring. There are changes of mild centrilobular emphysema in both lungs mostly in the upper lobes. There is some dependent subsegmental atelectasis in both lower lobes. No left lung mass or suspicious nodule.  No pleural effusions or pneumothorax.  Thoracic inlet: Mildly enlarged heterogeneous thyroid gland. No discrete measurable nodule. No neck base masses or adenopathy.  Mediastinum and hila: No masses or pathologically enlarged lymph nodes. Subcarinal node measures 1 cm short axis. There are several sub cm peritracheal nodes. Heart is normal in size. Great vessels are normal in caliber.  CT ABDOMEN AND PELVIS FINDINGS  Liver: 4 mm low-density lesion centrally. 5 mm low-density lesion, suggesting a fatty lesion, is seen in the left lobe. Focal fat projects along the inferior  falciform ligament. No other liver abnormalities.  Spleen, gallbladder, pancreas:  Unremarkable.  Adrenal glands. Left adrenal gland is thickened, measuring 17 mm in greatest transverse thickness. Normal right adrenal gland.  Kidneys, ureters, bladder: 5 mm low-density lesion from the lower pole left kidney consistent with a cyst. No other renal masses, no stones and no hydronephrosis. Normal ureters. Bladder is decompressed around a Foley catheter. Despite being collapse, the wall still appears thickened. No bladder mass.  Lymph nodes:  No enlarged or abnormal appearing lymph nodes.  Ascites: None.  Gastrointestinal: No bowel wall thickening or inflammatory changes. No bowel dilation is seen to suggest obstruction or adynamic ileus. No appendicitis.  MUSCULOSKELETAL  No osteoblastic or osteolytic lesions.  IMPRESSION: 1. 4 cm right upper lobe mass highly suspicious for a primary bronchogenic carcinoma. Irregular mass like opacity seen adjacent to this in the right upper lobe which may reflect conglomerate scarring, additional tumor or combination of both. Other areas of right upper lobe opacity are consistent with chronic scarring. There is an emphysematous bleb at the right apex. 2. No evidence of hilar or mediastinal metastatic disease or of left lung metastatic disease. 3. In the abdomen pelvis, the bladder wall appears thickened. Consider cystitis if there are consistent clinical symptoms. This could be a chronic finding in this patient. 4. No other evidence of an acute abnormality below the diaphragm. 5. 4 mm low-density lesion in the central liver. This may reflect a small cyst. Metastatic disease is not excluded but felt unlikely. Adjacent lower attenuation liver lesion appears to contain some fat. No other evidence to suggest metastatic disease within the abdomen or pelvis.  Electronically Signed: By: Lajean Manes M.D. On: 04/02/2015 13:44   Dg Chest Portable 1 View  04/02/2015   CLINICAL DATA:  Pt  brought in today by EMS for AMS and agitated pt with some shob.  EXAM: PORTABLE CHEST - 1 VIEW  COMPARISON:  None.  FINDINGS: 4.2 cm round opacity projects in the right mid lung. There is more heterogeneous airspace and coarse reticular type opacity above this in the right upper lobe. The right apex there is an emphysematous bleb. Left lung is clear.  Cardiac silhouette is normal in size. Normal mediastinal and left hilar contours. Right hilum is partly obscured by contiguous right upper lobe interstitial type opacities. No obvious hilar mass.  No convincing pleural effusion and no pneumothorax.  Bony thorax is intact.  IMPRESSION: 1. 4.2 cm masslike opacity in the right mid lung. This is concerning for malignancy. 2. Coarse reticular and patchy airspace opacity noted in the right upper lobe. This could reflect infection. 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 3. Recommend follow-up chest CT with contrast.   Electronically Signed   By: Lajean Manes M.D.   On: 04/02/2015 12:04    EKG:   Orders placed or performed during the hospital encounter of 04/02/15  . ED EKG  . ED EKG    IMPRESSION  AND PLAN:   69 year old male with no known past medical history not on any prescribed medications found with altered mental status at home this morning. Last known normal 5 days ago. Currently febrile at 102, leukocytosis at 26K, hypertensive, minimally responsive.  #1 fever and leukocytosis: Urine and chest x-ray negative for signs of infection. CT abdomen unremarkable. Blood cultures pending. No signs of skin infection. LP will be performed in the emergency room. He has been started on acyclovir, Fortaz and vancomycin. Will check HIV, hepatitis panel, RMSF, ESR. Lactic acid is 1.6. Start Airborne Precautions in Case of Meningitis.  # 2 malignant hypertension: Start Cardene drip and admitted to stepdown unit. CT of the head is negative for acute intracranial process. Malignant hypertension could cause hypertensive  encephalopathy.  #3 elevated troponin: 0.28 We'll continue to cycle enzymes, expect that this will decrease as blood pressure is controlled. Likely strain due to hypertension. EKG with no signs of ischemia. If persistently elevated will start heparin drip. Will obtain 2-D echocardiogram.  #4 hyperglycemia: Check hemoglobin A1c and monitor CBGs, no coverage. Not acidotic  #5 transaminitis: AST at 202 ALT 40 albumin decreased at 3.4. Check acute hepatitis panel. Ethanol and acetaminophen levels level normal.  #6 urine drug screen positive for cannabinoids. Question use of synthetic marijuana products which can result in altered mental status.  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 No sign of skin infection. No significant discomfort or signs of pain. 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  #8 4 cm right upper lobe lung mass suspicious for primary bronchogenic carcinoma, 4 mm low-density lesion in the liver: Fever and altered mental status possibly a neuroendocrine effect from malignancy. [facility] consult pulmonology for possible bronchoscopy.  All the records are reviewed and case discussed with ED provider. Management plans discussed with the patient, family and they are in agreement.  CODE STATUS: full  TOTAL TIME TAKING CARE OF THIS PATIENT: 55 minutes.  Greater than 50% of time spent in coordination of care and counseling.  Myrtis Ser M.D on 04/02/2015 at 3:40 PM  Between 7am to 6pm - Pager - [phone]  After 6pm go to [email or web address] - password [email or web address]  [facility]  Office  [phone]  CC: Primary care physician; No PCP Per Patient

## 2015-04-02 NOTE — ED Notes (Signed)
Pt transported to the floor via stretcher with RN and NA. Pt remained on the monitor for transport.

## 2015-04-02 NOTE — Progress Notes (Signed)
ANTICOAGULATION CONSULT NOTE - Initial Consult  Pharmacy Consult for Heparin Drip Management Indication: chest pain/ACS  No Known Allergies  Patient Measurements: Height: 5\' 11"  (180.3 cm) Weight: 190 lb (86.183 kg) IBW/kg (Calculated) : 75.3   Vital Signs: Temp: 98.8 F (37.1 C) (07/16 2000) Temp Source: Rectal (07/16 1118) BP: 141/88 mmHg (07/16 2010) Pulse Rate: 111 (07/16 2010)  Labs:  Recent Labs  04/02/15 1105 04/02/15 1754  HGB 14.3  --   HCT 43.6  --   PLT 281  --   LABPROT 15.4*  --   INR 1.20  --   CREATININE 1.20  --   TROPONINI 0.28* 0.36*    Estimated Creatinine Clearance: 61.9 mL/min (by C-G formula based on Cr of 1.2).   Medical History: History reviewed. No pertinent past medical history.  Medications:  Scheduled:  . acyclovir  850 mg Intravenous 3 times per day  . cefTAZidime (FORTAZ)  IV  2 g Intravenous 3 times per day  . folic acid  1 mg Intravenous Daily  . [START ON 04/03/2015] pneumococcal 23 valent vaccine  0.5 mL Intramuscular Tomorrow-1000  . thiamine  100 mg Intravenous Daily  . [START ON 04/03/2015] vancomycin  1,000 mg Intravenous Q12H   Infusions:  . heparin    . niCARDipine 15 mg/hr (04/02/15 2111)    Assessment: Pharmacy consulted to initiate heparin drip for 69 yo male ICU patient being treated for possible meningitis and now with elevated troponins x 2. Patient previously ordered heparin 5000 units SQ TID. Patient received last dose at 2100 on 7/16.    Goal of Therapy:  Heparin level 0.3-0.7 units/ml Monitor platelets by anticoagulation protocol: Yes   Plan:  As patient received SQ heparin 30 minutes ago, will forgo heparin bolus.  Start heparin infusion at 1100 units/hr Check anti-Xa level in 6 hours and daily while on heparin Continue to monitor H&H and platelets  Mckayla Mulcahey L 04/02/2015,9:26 PM

## 2015-04-02 NOTE — ED Notes (Signed)
Pt returned to room at this time. Report called to Trula Slade in the icu at this time. All questions answered. Pt will be transferred soon.

## 2015-04-02 NOTE — Procedures (Signed)
Interventional Radiology Procedure Note  Procedure: L2-L3 lumbar puncture under fluoro.  Opening pressure grossly elevated at 57 cm H2O.  CSF is cloudy highly concerning for bacterial meningitis.  A total of 18 mL fluid was removed and sent to the lab.   Complications: None  Estimated Blood Loss: 0  Recommendations: - Cx pending - Consider bacterial meningitis precautions - Bedrest x 2 hrs  Signed,  Criselda Peaches, MD

## 2015-04-03 ENCOUNTER — Inpatient Hospital Stay
Admit: 2015-04-03 | Discharge: 2015-04-03 | Disposition: A | Discharge status: Home / Self Care | Payer: Medicare Other | Attending: Internal Medicine | Admitting: Internal Medicine

## 2015-04-03 DIAGNOSIS — A419 Sepsis, unspecified organism: Secondary | ICD-10-CM | POA: Diagnosis not present

## 2015-04-03 LAB — URINALYSIS COMPLETE WITH MICROSCOPIC (ARMC ONLY)
BILIRUBIN URINE: NEGATIVE
Glucose, UA: 50 mg/dL — AB
KETONES UR: NEGATIVE mg/dL
LEUKOCYTES UA: NEGATIVE
Nitrite: NEGATIVE
PROTEIN: 100 mg/dL — AB
Specific Gravity, Urine: 1.023 (ref 1.005–1.030)
pH: 6 (ref 5.0–8.0)

## 2015-04-03 LAB — COMPREHENSIVE METABOLIC PANEL
ALBUMIN: 2.9 g/dL — AB (ref 3.5–5.0)
ALT: 35 U/L (ref 17–63)
ANION GAP: 13 (ref 5–15)
AST: 93 U/L — ABNORMAL HIGH (ref 15–41)
Alkaline Phosphatase: 70 U/L (ref 38–126)
BILIRUBIN TOTAL: 0.6 mg/dL (ref 0.3–1.2)
BUN: 25 mg/dL — ABNORMAL HIGH (ref 6–20)
CHLORIDE: 103 mmol/L (ref 101–111)
CO2: 23 mmol/L (ref 22–32)
Calcium: 8.8 mg/dL — ABNORMAL LOW (ref 8.9–10.3)
Creatinine, Ser: 1.18 mg/dL (ref 0.61–1.24)
GFR calc Af Amer: >60 mL/min (ref >60)
GFR calc non Af Amer: >60 mL/min (ref >60)
Glucose, Bld: 162 mg/dL — ABNORMAL HIGH (ref 65–99)
POTASSIUM: 3.1 mmol/L — AB (ref 3.5–5.1)
Sodium: 139 mmol/L (ref 135–145)
Total Protein: 8.1 g/dL (ref 6.5–8.1)

## 2015-04-03 LAB — CBC
HEMATOCRIT: 44.9 % (ref 40.0–52.0)
Hemoglobin: 14.4 g/dL (ref 13.0–18.0)
MCH: 29.2 pg (ref 26.0–34.0)
MCHC: 32.1 g/dL (ref 32.0–36.0)
MCV: 91 fL (ref 80.0–100.0)
Platelets: 275 10*3/uL (ref 150–440)
RBC: 4.94 MIL/uL (ref 4.40–5.90)
RDW: 14.8 % — AB (ref 11.5–14.5)
WBC: 29.9 10*3/uL — AB (ref 3.8–10.6)

## 2015-04-03 LAB — GLUCOSE, CAPILLARY
GLUCOSE-CAPILLARY: 158 mg/dL — AB (ref 65–99)
GLUCOSE-CAPILLARY: 142 mg/dL — AB (ref 65–99)
GLUCOSE-CAPILLARY: 157 mg/dL — AB (ref 65–99)
Glucose-Capillary: 178 mg/dL — ABNORMAL HIGH (ref 65–99)
Glucose-Capillary: 158 mg/dL — ABNORMAL HIGH (ref 65–99)

## 2015-04-03 LAB — HEMOGLOBIN A1C: HEMOGLOBIN A1C: 6.2 % — AB (ref 4.0–6.0)

## 2015-04-03 LAB — HEPARIN LEVEL (UNFRACTIONATED)
Heparin Unfractionated: 0.17 IU/mL — ABNORMAL LOW (ref 0.30–0.70)
Heparin Unfractionated: 0.34 IU/mL (ref 0.30–0.70)

## 2015-04-03 LAB — TROPONIN I: Troponin I: 0.56 ng/mL — ABNORMAL HIGH (ref <0.031)

## 2015-04-03 MED ORDER — POTASSIUM CHLORIDE 10 MEQ/100ML IV SOLN
10.0000 meq | INTRAVENOUS | Status: AC
  Administered 2015-04-03 (×2): 10 meq via INTRAVENOUS
  Filled 2015-04-03 (×2): qty 100

## 2015-04-03 MED ORDER — METOPROLOL TARTRATE 1 MG/ML IV SOLN
5.0000 mg | Freq: Four times a day (QID) | INTRAVENOUS | Status: DC
  Administered 2015-04-03 – 2015-04-04 (×6): 5 mg via INTRAVENOUS
  Filled 2015-04-03 (×6): qty 5

## 2015-04-03 MED ORDER — HEPARIN BOLUS VIA INFUSION
2600.0000 [IU] | Freq: Once | INTRAVENOUS | Status: AC
  Administered 2015-04-03: 2600 [IU] via INTRAVENOUS
  Filled 2015-04-03: qty 2600

## 2015-04-03 MED ORDER — ASPIRIN 300 MG RE SUPP
300.0000 mg | Freq: Every day | RECTAL | Status: DC
  Administered 2015-04-03 – 2015-04-04 (×2): 300 mg via RECTAL
  Filled 2015-04-03 (×2): qty 1

## 2015-04-03 MED ORDER — NITROGLYCERIN 2 % TD OINT
0.5000 [in_us] | TOPICAL_OINTMENT | Freq: Four times a day (QID) | TRANSDERMAL | Status: DC
  Administered 2015-04-03 – 2015-04-04 (×6): 0.5 [in_us] via TOPICAL
  Filled 2015-04-03 (×7): qty 1

## 2015-04-03 MED ORDER — SODIUM CHLORIDE 0.9 % IV SOLN
INTRAVENOUS | Status: DC
  Administered 2015-04-03 – 2015-04-04 (×2): via INTRAVENOUS

## 2015-04-03 NOTE — Progress Notes (Signed)
Date: 04/03/2015,   MRN# [id number] Nathan Thomas [birth year] Code Status:     Code Status Orders        Start     Ordered   04/02/15 1722  Full code   Continuous     04/02/15 1721     Hosp day:@LENGTHOFSTAYDAYS @ Referring MD: @ATDPROV @     PCP:      AdmissionWeight: 190 lb (86.183 kg)                 CurrentWeight: 190 lb (86.183 kg)   CC: lung mass  HPI: This is a 69 year old male, found unresponsive,  CSF leukocytosis. Being treated as meningitis/encophalitis, on w/u noted 4 cm RUL mass. Pulmonary consulted.   PMHX:   History reviewed. No pertinent past medical history. Surgical Hx:  History reviewed. No pertinent past surgical history. Family Hx:  History reviewed. No pertinent family history. Social Hx:   History  Substance Use Topics  . Smoking status: Current Every Day Smoker    Types: Cigarettes  . Smokeless tobacco: Not on file  . Alcohol Use: Not on file   Medication:    Home Medication:  No current outpatient prescriptions on file.  Current Medication: @CURMEDTAB @   Allergies:  Review of patient's allergies indicates no known allergies.  Review of Systems: Obtunded   Physical Examination:   VS: BP 149/82 mmHg  Pulse 114  Temp(Src) 101.2 F (38.4 C) (Axillary)  Resp 34  Ht 5\' 11"  (1.803 m)  Wt 190 lb (86.183 kg)  BMI 26.51 kg/m2  SpO2 94%  General Appearance: No distress  Neuro/psych open eyes to noise HEENT: PERRLA, EOM intact, no ptosis, no other lesions noticed, Mallampati: Pulmonary:.No wheezing, No rales  Sputum Production:   Cardiovascular:  Normal S1,S2.  No m/r/g.  Abdominal aorta pulsation normal.    Abdomen:Benign, Soft, non-tender, No masses, hepatosplenomegaly, No lymphadenopathy Endoc: No evident thyromegaly, no signs of acromegaly or Cushing features Skin:   warm, no rashes, no ecchymosis  Extremities: normal, no cyanosis, clubbing, +ve edema, warm with normal capillary refill. Other findings:   Labs results:   Recent  Labs     04/02/15  1105  04/03/15  0549  HGB  14.3  14.4  HCT  43.6  44.9  MCV  88.8  91.0  WBC  26.2*  29.9*  BUN  22*  25*  CREATININE  1.20  1.18  GLUCOSE  162*  162*  CALCIUM  9.5  8.8*  INR  1.20   --   ,    No results for input(s): PH in the last 72 hours.  Invalid input(s): PCO2, PO2, BASEEXCESS, BASEDEFICITE, TFT  Culture results:     Rad results:   Ct Chest W Contrast  04/02/2015   ADDENDUM REPORT: 04/02/2015 14:25  ADDENDUM: Patient also has a distended scrotum which appears to be due to large bilateral hydroceles. This could be further assessed, if desired clinically, with scrotal ultrasound. No scrotal air is seen to suggest a gas-forming infection.   Electronically Signed   By: Lajean Manes M.D.   On: 04/02/2015 14:25   04/02/2015   CLINICAL DATA:  Pt via EMS found by brother this am with AMS. Last time known well last Tuesday. Usually A&Ox4 per EMS report. Pt with abnormal chest x-ray from today and scrotal swelling noted on physical exam. Pt unable to answer any med hx questions  EXAM: CT CHEST, ABDOMEN, AND PELVIS WITH CONTRAST  TECHNIQUE: Multidetector CT imaging of the  chest, abdomen and pelvis was performed following the standard protocol during bolus administration of intravenous contrast.  CONTRAST:  165mL OMNIPAQUE IOHEXOL 300 MG/ML  SOLN  COMPARISON:  Current chest radiograph  FINDINGS: CT CHEST FINDINGS  Lungs and pleura: 4 cm mass in the right upper lobe correlates with the oval masslike opacity noted on the current chest radiograph. There is also a right upper lobe irregular patchy opacity in coarse reticular opacity. The areas of contiguous regular patchy opacity, which extend from the mass to the superior lateral right upper lobe pleural margin, may reflect additional tumor, conglomerate scarring or a combination. There also cystic areas and bronchiectasis with a prominent bleb at the right apex. Much of the coarse reticular opacities are likely due scarring.  There are changes of mild centrilobular emphysema in both lungs mostly in the upper lobes. There is some dependent subsegmental atelectasis in both lower lobes. No left lung mass or suspicious nodule. No pleural effusions or pneumothorax.  Thoracic inlet: Mildly enlarged heterogeneous thyroid gland. No discrete measurable nodule. No neck base masses or adenopathy.  Mediastinum and hila: No masses or pathologically enlarged lymph nodes. Subcarinal node measures 1 cm short axis. There are several sub cm peritracheal nodes. Heart is normal in size. Great vessels are normal in caliber.  CT ABDOMEN AND PELVIS FINDINGS  Liver: 4 mm low-density lesion centrally. 5 mm low-density lesion, suggesting a fatty lesion, is seen in the left lobe. Focal fat projects along the inferior falciform ligament. No other liver abnormalities.  Spleen, gallbladder, pancreas:  Unremarkable.  Adrenal glands. Left adrenal gland is thickened, measuring 17 mm in greatest transverse thickness. Normal right adrenal gland.  Kidneys, ureters, bladder: 5 mm low-density lesion from the lower pole left kidney consistent with a cyst. No other renal masses, no stones and no hydronephrosis. Normal ureters. Bladder is decompressed around a Foley catheter. Despite being collapse, the wall still appears thickened. No bladder mass.  Lymph nodes:  No enlarged or abnormal appearing lymph nodes.  Ascites: None.  Gastrointestinal: No bowel wall thickening or inflammatory changes. No bowel dilation is seen to suggest obstruction or adynamic ileus. No appendicitis.  MUSCULOSKELETAL  No osteoblastic or osteolytic lesions.  IMPRESSION: 1. 4 cm right upper lobe mass highly suspicious for a primary bronchogenic carcinoma. Irregular mass like opacity seen adjacent to this in the right upper lobe which may reflect conglomerate scarring, additional tumor or combination of both. Other areas of right upper lobe opacity are consistent with chronic scarring. There is an  emphysematous bleb at the right apex. 2. No evidence of hilar or mediastinal metastatic disease or of left lung metastatic disease. 3. In the abdomen pelvis, the bladder wall appears thickened. Consider cystitis if there are consistent clinical symptoms. This could be a chronic finding in this patient. 4. No other evidence of an acute abnormality below the diaphragm. 5. 4 mm low-density lesion in the central liver. This may reflect a small cyst. Metastatic disease is not excluded but felt unlikely. Adjacent lower attenuation liver lesion appears to contain some fat. No other evidence to suggest metastatic disease within the abdomen or pelvis.  Electronically Signed: By: Lajean Manes M.D. On: 04/02/2015 13:44   Ct Abdomen Pelvis W Contrast  04/02/2015   ADDENDUM REPORT: 04/02/2015 14:25  ADDENDUM: Patient also has a distended scrotum which appears to be due to large bilateral hydroceles. This could be further assessed, if desired clinically, with scrotal ultrasound. No scrotal air is seen to suggest a gas-forming infection.  Electronically Signed   By: Lajean Manes M.D.   On: 04/02/2015 14:25   04/02/2015   CLINICAL DATA:  Pt via EMS found by brother this am with AMS. Last time known well last Tuesday. Usually A&Ox4 per EMS report. Pt with abnormal chest x-ray from today and scrotal swelling noted on physical exam. Pt unable to answer any med hx questions  EXAM: CT CHEST, ABDOMEN, AND PELVIS WITH CONTRAST  TECHNIQUE: Multidetector CT imaging of the chest, abdomen and pelvis was performed following the standard protocol during bolus administration of intravenous contrast.  CONTRAST:  133mL OMNIPAQUE IOHEXOL 300 MG/ML  SOLN  COMPARISON:  Current chest radiograph  FINDINGS: CT CHEST FINDINGS  Lungs and pleura: 4 cm mass in the right upper lobe correlates with the oval masslike opacity noted on the current chest radiograph. There is also a right upper lobe irregular patchy opacity in coarse reticular opacity. The  areas of contiguous regular patchy opacity, which extend from the mass to the superior lateral right upper lobe pleural margin, may reflect additional tumor, conglomerate scarring or a combination. There also cystic areas and bronchiectasis with a prominent bleb at the right apex. Much of the coarse reticular opacities are likely due scarring. There are changes of mild centrilobular emphysema in both lungs mostly in the upper lobes. There is some dependent subsegmental atelectasis in both lower lobes. No left lung mass or suspicious nodule. No pleural effusions or pneumothorax.  Thoracic inlet: Mildly enlarged heterogeneous thyroid gland. No discrete measurable nodule. No neck base masses or adenopathy.  Mediastinum and hila: No masses or pathologically enlarged lymph nodes. Subcarinal node measures 1 cm short axis. There are several sub cm peritracheal nodes. Heart is normal in size. Great vessels are normal in caliber.  CT ABDOMEN AND PELVIS FINDINGS  Liver: 4 mm low-density lesion centrally. 5 mm low-density lesion, suggesting a fatty lesion, is seen in the left lobe. Focal fat projects along the inferior falciform ligament. No other liver abnormalities.  Spleen, gallbladder, pancreas:  Unremarkable.  Adrenal glands. Left adrenal gland is thickened, measuring 17 mm in greatest transverse thickness. Normal right adrenal gland.  Kidneys, ureters, bladder: 5 mm low-density lesion from the lower pole left kidney consistent with a cyst. No other renal masses, no stones and no hydronephrosis. Normal ureters. Bladder is decompressed around a Foley catheter. Despite being collapse, the wall still appears thickened. No bladder mass.  Lymph nodes:  No enlarged or abnormal appearing lymph nodes.  Ascites: None.  Gastrointestinal: No bowel wall thickening or inflammatory changes. No bowel dilation is seen to suggest obstruction or adynamic ileus. No appendicitis.  MUSCULOSKELETAL  No osteoblastic or osteolytic lesions.   IMPRESSION: 1. 4 cm right upper lobe mass highly suspicious for a primary bronchogenic carcinoma. Irregular mass like opacity seen adjacent to this in the right upper lobe which may reflect conglomerate scarring, additional tumor or combination of both. Other areas of right upper lobe opacity are consistent with chronic scarring. There is an emphysematous bleb at the right apex. 2. No evidence of hilar or mediastinal metastatic disease or of left lung metastatic disease. 3. In the abdomen pelvis, the bladder wall appears thickened. Consider cystitis if there are consistent clinical symptoms. This could be a chronic finding in this patient. 4. No other evidence of an acute abnormality below the diaphragm. 5. 4 mm low-density lesion in the central liver. This may reflect a small cyst. Metastatic disease is not excluded but felt unlikely. Adjacent lower attenuation liver lesion  appears to contain some fat. No other evidence to suggest metastatic disease within the abdomen or pelvis.  Electronically Signed: By: Lajean Manes M.D. On: 04/02/2015 13:44   Dg Lumbar Puncture Fluoro Guide  04/03/2015   CLINICAL DATA:  69 year old male with fever, leukocytosis and altered mental status. Source of the infection the [place] far undetermined. Lumbar puncture attempted in the emergency department by 2 separate physicians. Procedure was unsuccessful secondary to bony hypertrophy of the spinous processes. Emergent lumbar puncture under fluoroscopy is warranted.  EXAM: DIAGNOSTIC LUMBAR PUNCTURE UNDER FLUOROSCOPIC GUIDANCE  FLUOROSCOPY TIME:  If the device does not provide the exposure index:  Fluoroscopy Time (in minutes and seconds):  30 seconds  Number of Acquired Images:  0  PROCEDURE: Informed consent was obtained from the patient prior to the procedure, including potential complications of headache, allergy, and pain. With the patient prone, the lower back was prepped with Betadine. 1% Lidocaine was used for local anesthesia.  Lumbar puncture was performed at the L2-L3 level using a 20 gauge needle with return of cloudy CSF with an opening pressure of 57 cm water. A total of 18 ml of CSF were obtained for laboratory studies. The patient tolerated the procedure well and there were no apparent complications.  IMPRESSION: 1. Successful L2-L3 lumbar puncture under fluoroscopic guidance. 2. Elevated opening pressure of 57 cm H2O 3. Return of cloudy CSF concerning for bacterial meningitis. A total of 18 mL CSF was obtained secondary to elevation of the pressure. Sample sent for stat Gram stain and culture. Signed,  Criselda Peaches, MD  [facility]  [facility]   Electronically Signed   By: Jacqulynn Cadet M.D.   On: 04/03/2015 08:13       Assessment and Plan: RUL mass, concern of malignancy -when stable consider TTNA vs bronch  Neurologically looks like meningitis/encephilitis -neurology to see -rocephin, vanco, acyclovir -follow up on csf cultures     I have personally obtained a history, examined the patient, evaluated laboratory and imaging results, formulated the assessment and plan and placed orders.  The Patient requires high complexity decision making for assessment and support, frequent evaluation and titration of therapies, application of advanced monitoring technologies and extensive interpretation of multiple databases.   Clotilde Loth,M.D. Pulmonary & Critical care Medicine [facility]

## 2015-04-03 NOTE — Progress Notes (Signed)
ANTICOAGULATION CONSULT NOTE - Follow Up Consult  Pharmacy Consult for Heparin Drip Management Indication: chest pain/ACS  No Known Allergies  Patient Measurements: Height: 5\' 11"  (180.3 cm) Weight: 190 lb (86.183 kg) IBW/kg (Calculated) : 75.3   Vital Signs: Temp: 98.3 F (36.8 C) (07/17 0500) BP: 169/83 mmHg (07/17 0600) Pulse Rate: 111 (07/17 0600)  Labs:  Recent Labs  04/02/15 1105 04/02/15 1754 04/02/15 2321 04/03/15 0549  HGB 14.3  --   --  14.4  HCT 43.6  --   --  44.9  PLT 281  --   --  275  LABPROT 15.4*  --   --   --   INR 1.20  --   --   --   HEPARINUNFRC  --   --   --  0.17*  CREATININE 1.20  --   --  1.18  TROPONINI 0.28* 0.36* 0.52* 0.56*    Estimated Creatinine Clearance: 62.9 mL/min (by C-G formula based on Cr of 1.18).   Medical History: History reviewed. No pertinent past medical history.  Medications:  Scheduled:  . acyclovir  850 mg Intravenous 3 times per day  . cefTAZidime (FORTAZ)  IV  2 g Intravenous 3 times per day  . folic acid  1 mg Intravenous Daily  . heparin  2,600 Units Intravenous Once  . pneumococcal 23 valent vaccine  0.5 mL Intramuscular Tomorrow-1000  . thiamine  100 mg Intravenous Daily  . vancomycin  1,000 mg Intravenous Q12H   Infusions:  . heparin 1,100 Units/hr (04/02/15 2329)  . niCARDipine 8 mg/hr (04/03/15 0051)    Assessment: Pharmacy consulted to initiate heparin drip for 69 yo male ICU patient being treated for possible meningitis and now with elevated troponins x 2. HL subtherapeutic.   HL on 7/17 at 0549: 0.17  Goal of Therapy:  Heparin level 0.3-0.7 units/ml Monitor platelets by anticoagulation protocol: Yes   Plan:  HL lower than goal range.  Will order heparin bolus of 2600 units IV once and increase drip rate to 1350 units/hr.  Will order recheck for HL for 1300 today.  Will order CBC for tomorrow AM.  Pharmacy will continue to follow.  Harvy Riera G 04/03/2015,6:49 AM

## 2015-04-03 NOTE — Progress Notes (Signed)
[place] [facility]                                                                                                                                                                                            Patient Demographics   Nathan Thomas, is a 69 y.o. male, DOB - 02-Jan-1946, ERX:[id number]  Admit date - 04/02/2015   Admitting Physician Aldean Jewett, MD  Outpatient Primary MD for the patient is No PCP Per Patient   LOS - 1  Subjective: Patient admitted after being found unresponsive. He had a LP that shows severe leukocytosis he continues to have minimal responsiveness. His nephews at the bedside     Review of Systems:   Poorly responsive  Vitals:   Filed Vitals:   04/03/15 1030 04/03/15 1045 04/03/15 1100 04/03/15 1115  BP: 158/79 167/71 143/73 149/82  Pulse: 120 109 119 114  Temp:      TempSrc:      Resp: 24 36 33 34  Height:      Weight:      SpO2: 95% 95% 95% 94%    Wt Readings from Last 3 Encounters:  04/02/15 86.183 kg (190 lb)     Intake/Output Summary (Last 24 hours) at 04/03/15 1212 Last data filed at 04/03/15 9509  Gross per 24 hour  Intake 2228.34 ml  Output   1850 ml  Net 378.34 ml    Physical Exam:   GENERAL: Critically ill male HEAD, EYES, EARS, NOSE AND THROAT: Atraumatic, normocephalic. Extraocular muscles are intact. Pupils equal and reactive to light. Sclerae anicteric. No conjunctival injection. No oro-pharyngeal erythema.  NECK: Supple. There is no jugular venous distention. No bruits, no lymphadenopathy, no thyromegaly.  HEART: Regular rate and rhythm, tachycardic. No murmurs, no rubs, no clicks.  LUNGS: Clear to auscultation bilaterally. No rales or rhonchi. No wheezes.  ABDOMEN: Soft, flat, nontender, nondistended. Has good bowel sounds. No hepatosplenomegaly appreciated.  EXTREMITIES: No evidence of any cyanosis, clubbing, or peripheral edema.  +2 pedal and radial pulses bilaterally.   NEUROLOGIC: Lethargic hard to arouse despite sternal rub SKIN: Moist and warm with no rashes appreciated.  Psych: Poorly responsive LN: No inguinal LN enlargement    Antibiotics   Anti-infectives    Start     Dose/Rate Route Frequency Ordered Stop   04/03/15 1830  vancomycin (VANCOCIN) IVPB 1000 mg/200 mL premix     1,000 mg 200 mL/hr over 60 Minutes Intravenous Every 12 hours 04/02/15 1800     04/02/15 2300  acyclovir (ZOVIRAX) 850 mg in dextrose 5 % 150 mL IVPB  850 mg 167 mL/hr over 60 Minutes Intravenous 3 times per day 04/02/15 1800     04/02/15 1630  vancomycin (VANCOCIN) IVPB 1000 mg/200 mL premix  Status:  Discontinued     1,000 mg 200 mL/hr over 60 Minutes Intravenous Every 12 hours 04/02/15 1800 04/02/15 1800   04/02/15 1515  acyclovir (ZOVIRAX) 860 mg in dextrose 5 % 150 mL IVPB     10 mg/kg  86.2 kg 167.2 mL/hr over 60 Minutes Intravenous  Once 04/02/15 1504 04/02/15 1625   04/02/15 1400  cefTAZidime (FORTAZ) 2 g in dextrose 5 % 50 mL IVPB     2 g 100 mL/hr over 30 Minutes Intravenous 3 times per day 04/02/15 1105     04/02/15 1115  vancomycin (VANCOCIN) IVPB 1000 mg/200 mL premix     1,000 mg 200 mL/hr over 60 Minutes Intravenous  Once 04/02/15 1104 04/02/15 1215      Medications   Scheduled Meds: . acyclovir  850 mg Intravenous 3 times per day  . cefTAZidime (FORTAZ)  IV  2 g Intravenous 3 times per day  . folic acid  1 mg Intravenous Daily  . metoprolol  5 mg Intravenous 4 times per day  . potassium chloride  10 mEq Intravenous Q1 Hr x 2  . thiamine  100 mg Intravenous Daily  . vancomycin  1,000 mg Intravenous Q12H   Continuous Infusions: . niCARDipine 8 mg/hr (04/03/15 0830)   PRN Meds:.acetaminophen **OR** acetaminophen, hydrALAZINE   Data Review:   Micro Results Recent Results (from the past 240 hour(s))  Blood culture (routine x 2)     Status: None (Preliminary result)   Collection Time: 04/02/15 11:06 AM  Result Value Ref Range Status    Specimen Description BLOOD RIGHT ASSIST CONTROL  Final   Special Requests BOTTLES DRAWN AEROBIC AND ANAEROBIC 2CC  Final   Culture NO GROWTH 1 DAY  Final   Report Status PENDING  Incomplete  Blood culture (routine x 2)     Status: None (Preliminary result)   Collection Time: 04/02/15 11:13 AM  Result Value Ref Range Status   Specimen Description BLOOD RIGHT ASSIST CONTROL  Final   Special Requests   Final    BOTTLES DRAWN AEROBIC AND ANAEROBIC ANA 3CC AER 2 CC   Culture NO GROWTH 1 DAY  Final   Report Status PENDING  Incomplete  CSF culture     Status: None (Preliminary result)   Collection Time: 04/02/15  5:00 PM  Result Value Ref Range Status   Specimen Description CSF  Final   Special Requests Normal  Final   Gram Stain   Final    MANY POLYMORPHONUCLEAR WBC SEEN NO ORGANISMS SEEN    Culture NO GROWTH < 24 HOURS  Final   Report Status PENDING  Incomplete  Culture, fungus without smear-CSF     Status: None (Preliminary result)   Collection Time: 04/02/15  5:00 PM  Result Value Ref Range Status   Specimen Description CSF  Final   Special Requests Normal  Final   Culture NO GROWTH < 24 HOURS  Final   Report Status PENDING  Incomplete  MRSA PCR Screening     Status: None   Collection Time: 04/02/15  9:00 PM  Result Value Ref Range Status   MRSA by PCR NEGATIVE NEGATIVE Final    Comment:        The GeneXpert MRSA Assay (FDA approved for NASAL specimens only), is one component of a comprehensive MRSA colonization  surveillance program. It is not intended to diagnose MRSA infection nor to guide or monitor treatment for MRSA infections.     Radiology Reports Ct Head Wo Contrast  04/02/2015   CLINICAL DATA:  Pt via EMS found by brother this am with AMS. Last time known well last Tuesday. Usually A&Ox4 per EMS report. Pt uncooperative at this time. Agitated.  EXAM: CT HEAD WITHOUT CONTRAST  TECHNIQUE: Contiguous axial images were obtained from the base of the skull through the  vertex without intravenous contrast.  COMPARISON:  None  FINDINGS: Ventricles are normal in configuration. There is mild, age related, ventricular and sulcal enlargement. No hydrocephalus.  There are no parenchymal masses or mass effect. There is no evidence of a recent infarct. Small area of hypoattenuation the left cerebellum consistent with an old infarct.  There are no extra-axial masses or abnormal fluid collections.  There is no intracranial hemorrhage.  Visualized sinuses and mastoid air cells are clear. No skull fracture or lesion.  IMPRESSION: 1. No acute intracranial abnormalities. 2. Age related volume loss.  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   Electronically Signed   By: Lajean Manes M.D.   On: 04/02/2015 11:55   Ct Chest W Contrast  04/02/2015   ADDENDUM REPORT: 04/02/2015 14:25  ADDENDUM: Patient also has a distended scrotum which appears to be due to large bilateral hydroceles. This could be further assessed, if desired clinically, with scrotal ultrasound. No scrotal air is seen to suggest a gas-forming infection.   Electronically Signed   By: Lajean Manes M.D.   On: 04/02/2015 14:25   04/02/2015   CLINICAL DATA:  Pt via EMS found by brother this am with AMS. Last time known well last Tuesday. Usually A&Ox4 per EMS report. Pt with abnormal chest x-ray from today and scrotal swelling noted on physical exam. Pt unable to answer any med hx questions  EXAM: CT CHEST, ABDOMEN, AND PELVIS WITH CONTRAST  TECHNIQUE: Multidetector CT imaging of the chest, abdomen and pelvis was performed following the standard protocol during bolus administration of intravenous contrast.  CONTRAST:  123mL OMNIPAQUE IOHEXOL 300 MG/ML  SOLN  COMPARISON:  Current chest radiograph  FINDINGS: CT CHEST FINDINGS  Lungs and pleura: 4 cm mass in the right upper lobe correlates with the oval masslike opacity noted on the current chest radiograph. There is also a right upper lobe irregular patchy opacity in coarse reticular  opacity. The areas of contiguous regular patchy opacity, which extend from the mass to the superior lateral right upper lobe pleural margin, may reflect additional tumor, conglomerate scarring or a combination. There also cystic areas and bronchiectasis with a prominent bleb at the right apex. Much of the coarse reticular opacities are likely due scarring. There are changes of mild centrilobular emphysema in both lungs mostly in the upper lobes. There is some dependent subsegmental atelectasis in both lower lobes. No left lung mass or suspicious nodule. No pleural effusions or pneumothorax.  Thoracic inlet: Mildly enlarged heterogeneous thyroid gland. No discrete measurable nodule. No neck base masses or adenopathy.  Mediastinum and hila: No masses or pathologically enlarged lymph nodes. Subcarinal node measures 1 cm short axis. There are several sub cm peritracheal nodes. Heart is normal in size. Great vessels are normal in caliber.  CT ABDOMEN AND PELVIS FINDINGS  Liver: 4 mm low-density lesion centrally. 5 mm low-density lesion, suggesting a fatty lesion, is seen in the left lobe. Focal fat projects along the inferior falciform ligament. No other liver abnormalities.  Spleen, gallbladder, pancreas:  Unremarkable.  Adrenal glands. Left adrenal gland is thickened, measuring 17 mm in greatest transverse thickness. Normal right adrenal gland.  Kidneys, ureters, bladder: 5 mm low-density lesion from the lower pole left kidney consistent with a cyst. No other renal masses, no stones and no hydronephrosis. Normal ureters. Bladder is decompressed around a Foley catheter. Despite being collapse, the wall still appears thickened. No bladder mass.  Lymph nodes:  No enlarged or abnormal appearing lymph nodes.  Ascites: None.  Gastrointestinal: No bowel wall thickening or inflammatory changes. No bowel dilation is seen to suggest obstruction or adynamic ileus. No appendicitis.  MUSCULOSKELETAL  No osteoblastic or osteolytic  lesions.  IMPRESSION: 1. 4 cm right upper lobe mass highly suspicious for a primary bronchogenic carcinoma. Irregular mass like opacity seen adjacent to this in the right upper lobe which may reflect conglomerate scarring, additional tumor or combination of both. Other areas of right upper lobe opacity are consistent with chronic scarring. There is an emphysematous bleb at the right apex. 2. No evidence of hilar or mediastinal metastatic disease or of left lung metastatic disease. 3. In the abdomen pelvis, the bladder wall appears thickened. Consider cystitis if there are consistent clinical symptoms. This could be a chronic finding in this patient. 4. No other evidence of an acute abnormality below the diaphragm. 5. 4 mm low-density lesion in the central liver. This may reflect a small cyst. Metastatic disease is not excluded but felt unlikely. Adjacent lower attenuation liver lesion appears to contain some fat. No other evidence to suggest metastatic disease within the abdomen or pelvis.  Electronically Signed: By: Lajean Manes M.D. On: 04/02/2015 13:44   Ct Abdomen Pelvis W Contrast  04/02/2015   ADDENDUM REPORT: 04/02/2015 14:25  ADDENDUM: Patient also has a distended scrotum which appears to be due to large bilateral hydroceles. This could be further assessed, if desired clinically, with scrotal ultrasound. No scrotal air is seen to suggest a gas-forming infection.   Electronically Signed   By: Lajean Manes M.D.   On: 04/02/2015 14:25   04/02/2015   CLINICAL DATA:  Pt via EMS found by brother this am with AMS. Last time known well last Tuesday. Usually A&Ox4 per EMS report. Pt with abnormal chest x-ray from today and scrotal swelling noted on physical exam. Pt unable to answer any med hx questions  EXAM: CT CHEST, ABDOMEN, AND PELVIS WITH CONTRAST  TECHNIQUE: Multidetector CT imaging of the chest, abdomen and pelvis was performed following the standard protocol during bolus administration of intravenous  contrast.  CONTRAST:  138mL OMNIPAQUE IOHEXOL 300 MG/ML  SOLN  COMPARISON:  Current chest radiograph  FINDINGS: CT CHEST FINDINGS  Lungs and pleura: 4 cm mass in the right upper lobe correlates with the oval masslike opacity noted on the current chest radiograph. There is also a right upper lobe irregular patchy opacity in coarse reticular opacity. The areas of contiguous regular patchy opacity, which extend from the mass to the superior lateral right upper lobe pleural margin, may reflect additional tumor, conglomerate scarring or a combination. There also cystic areas and bronchiectasis with a prominent bleb at the right apex. Much of the coarse reticular opacities are likely due scarring. There are changes of mild centrilobular emphysema in both lungs mostly in the upper lobes. There is some dependent subsegmental atelectasis in both lower lobes. No left lung mass or suspicious nodule. No pleural effusions or pneumothorax.  Thoracic inlet: Mildly enlarged heterogeneous thyroid gland. No discrete measurable  nodule. No neck base masses or adenopathy.  Mediastinum and hila: No masses or pathologically enlarged lymph nodes. Subcarinal node measures 1 cm short axis. There are several sub cm peritracheal nodes. Heart is normal in size. Great vessels are normal in caliber.  CT ABDOMEN AND PELVIS FINDINGS  Liver: 4 mm low-density lesion centrally. 5 mm low-density lesion, suggesting a fatty lesion, is seen in the left lobe. Focal fat projects along the inferior falciform ligament. No other liver abnormalities.  Spleen, gallbladder, pancreas:  Unremarkable.  Adrenal glands. Left adrenal gland is thickened, measuring 17 mm in greatest transverse thickness. Normal right adrenal gland.  Kidneys, ureters, bladder: 5 mm low-density lesion from the lower pole left kidney consistent with a cyst. No other renal masses, no stones and no hydronephrosis. Normal ureters. Bladder is decompressed around a Foley catheter. Despite being  collapse, the wall still appears thickened. No bladder mass.  Lymph nodes:  No enlarged or abnormal appearing lymph nodes.  Ascites: None.  Gastrointestinal: No bowel wall thickening or inflammatory changes. No bowel dilation is seen to suggest obstruction or adynamic ileus. No appendicitis.  MUSCULOSKELETAL  No osteoblastic or osteolytic lesions.  IMPRESSION: 1. 4 cm right upper lobe mass highly suspicious for a primary bronchogenic carcinoma. Irregular mass like opacity seen adjacent to this in the right upper lobe which may reflect conglomerate scarring, additional tumor or combination of both. Other areas of right upper lobe opacity are consistent with chronic scarring. There is an emphysematous bleb at the right apex. 2. No evidence of hilar or mediastinal metastatic disease or of left lung metastatic disease. 3. In the abdomen pelvis, the bladder wall appears thickened. Consider cystitis if there are consistent clinical symptoms. This could be a chronic finding in this patient. 4. No other evidence of an acute abnormality below the diaphragm. 5. 4 mm low-density lesion in the central liver. This may reflect a small cyst. Metastatic disease is not excluded but felt unlikely. Adjacent lower attenuation liver lesion appears to contain some fat. No other evidence to suggest metastatic disease within the abdomen or pelvis.  Electronically Signed: By: Lajean Manes M.D. On: 04/02/2015 13:44   Dg Chest Portable 1 View  04/02/2015   CLINICAL DATA:  Pt brought in today by EMS for AMS and agitated pt with some shob.  EXAM: PORTABLE CHEST - 1 VIEW  COMPARISON:  None.  FINDINGS: 4.2 cm round opacity projects in the right mid lung. There is more heterogeneous airspace and coarse reticular type opacity above this in the right upper lobe. The right apex there is an emphysematous bleb. Left lung is clear.  Cardiac silhouette is normal in size. Normal mediastinal and left hilar contours. Right hilum is partly obscured by  contiguous right upper lobe interstitial type opacities. No obvious hilar mass.  No convincing pleural effusion and no pneumothorax.  Bony thorax is intact.  IMPRESSION: 1. 4.2 cm masslike opacity in the right mid lung. This is concerning for malignancy. 2. Coarse reticular and patchy airspace opacity noted in the right upper lobe. This could reflect infection. 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 3. Recommend follow-up chest CT with contrast.   Electronically Signed   By: Lajean Manes M.D.   On: 04/02/2015 12:04   Dg Lumbar Puncture Fluoro Guide  04/03/2015   CLINICAL DATA:  69 year old male with fever, leukocytosis and altered mental status. Source of the infection the [place] far undetermined. Lumbar puncture attempted in the emergency department by 2 separate physicians. Procedure was  unsuccessful secondary to bony hypertrophy of the spinous processes. Emergent lumbar puncture under fluoroscopy is warranted.  EXAM: DIAGNOSTIC LUMBAR PUNCTURE UNDER FLUOROSCOPIC GUIDANCE  FLUOROSCOPY TIME:  If the device does not provide the exposure index:  Fluoroscopy Time (in minutes and seconds):  30 seconds  Number of Acquired Images:  0  PROCEDURE: Informed consent was obtained from the patient prior to the procedure, including potential complications of headache, allergy, and pain. With the patient prone, the lower back was prepped with Betadine. 1% Lidocaine was used for local anesthesia. Lumbar puncture was performed at the L2-L3 level using a 20 gauge needle with return of cloudy CSF with an opening pressure of 57 cm water. A total of 18 ml of CSF were obtained for laboratory studies. The patient tolerated the procedure well and there were no apparent complications.  IMPRESSION: 1. Successful L2-L3 lumbar puncture under fluoroscopic guidance. 2. Elevated opening pressure of 57 cm H2O 3. Return of cloudy CSF concerning for bacterial meningitis. A total of 18 mL CSF was obtained secondary to elevation of the pressure.  Sample sent for stat Gram stain and culture. Signed,  Criselda Peaches, MD  [facility]  [facility]   Electronically Signed   By: Jacqulynn Cadet M.D.   On: 04/03/2015 08:13     CBC  Recent Labs Lab 04/02/15 1105 04/03/15 0549  WBC 26.2* 29.9*  HGB 14.3 14.4  HCT 43.6 44.9  PLT 281 275  MCV 88.8 91.0  MCH 29.0 29.2  MCHC 32.7 32.1  RDW 14.9* 14.8*  LYMPHSABS 0.9*  --   MONOABS 1.5*  --   EOSABS 0.0  --   BASOSABS 0.0  --     Chemistries   Recent Labs Lab 04/02/15 1105 04/03/15 0549  NA 138 139  K 3.6 3.1*  CL 100* 103  CO2 25 23  GLUCOSE 162* 162*  BUN 22* 25*  CREATININE 1.20 1.18  CALCIUM 9.5 8.8*  MG 2.2  --   AST 202* 93*  ALT 40 35  ALKPHOS 81 70  BILITOT 0.5 0.6   ------------------------------------------------------------------------------------------------------------------ estimated creatinine clearance is 62.9 mL/min (by C-G formula based on Cr of 1.18). ------------------------------------------------------------------------------------------------------------------ No results for input(s): HGBA1C in the last 72 hours. ------------------------------------------------------------------------------------------------------------------ No results for input(s): CHOL, HDL, LDLCALC, TRIG, CHOLHDL, LDLDIRECT in the last 72 hours. ------------------------------------------------------------------------------------------------------------------  Recent Labs  04/02/15 1105  TSH 1.793   ------------------------------------------------------------------------------------------------------------------ No results for input(s): VITAMINB12, FOLATE, FERRITIN, TIBC, IRON, RETICCTPCT in the last 72 hours.  Coagulation profile  Recent Labs Lab 04/02/15 1105  INR 1.20    No results for input(s): DDIMER in the last 72 hours.  Cardiac Enzymes  Recent Labs Lab 04/02/15 1754 04/02/15 2321 04/03/15 0549   TROPONINI 0.36* 0.52* 0.56*   ------------------------------------------------------------------------------------------------------------------ Invalid input(s): POCBNP    Assessment & Plan   69 year old male with no known past medical history not on any prescribed medications found with altered mental status at home this morning. Last known normal 5 days ago. Currently febrile at 102, leukocytosis at 26K, hypertensive, minimally responsive.  #1 fever and leukocytosis: Due to severe meningitis, also possible encephalitis I will ask neurology to see the patient obtain MRI of the brain # 2 malignant hypertension: I will start him on a Nitropatch place scheduled metoprolol, try to wean them in the car to pain  #3 elevated troponin: 0.28 likely due to demand ischemia I'll stop the heparin drip  #4 hyperglycemia: Possible reactive  #5 transaminitis: AST  at 202 ALT 40 albumin decreased at 3.4. Hepatitis panel pending .  #6 urine drug screen positive for cannabinoids. Question use of synthetic marijuana products which can result in altered mental status.  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 No sign of skin infection. No significant discomfort or signs of pain. 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  #8 4 cm right upper lobe lung mass suspicious for primary bronchogenic carcinoma monitor for now pulmonary consult was ordered by the admitting physician     Code Status Orders        Start     Ordered   04/02/15 1722  Full code   Continuous     04/02/15 1721           Consults   DVT Prophylaxis  Lovenox  Lab Results  Component Value Date   PLT 275 04/03/2015     Time Spent in minutes   45 minutes of critical care time    Greater than 50% of time spent in care coordination and counseling.   Dustin Flock M.D on 04/03/2015 at 12:12 PM  Between 7am to 6pm - Pager - [phone]  After 6pm go to [email or web address] - password [email or web address]   Office  [phone]

## 2015-04-04 ENCOUNTER — Inpatient Hospital Stay: Payer: Medicare Other

## 2015-04-04 DIAGNOSIS — I330 Acute and subacute infective endocarditis: Secondary | ICD-10-CM

## 2015-04-04 DIAGNOSIS — G009 Bacterial meningitis, unspecified: Secondary | ICD-10-CM

## 2015-04-04 LAB — GLUCOSE, CAPILLARY
GLUCOSE-CAPILLARY: 133 mg/dL — AB (ref 65–99)
GLUCOSE-CAPILLARY: 156 mg/dL — AB (ref 65–99)
Glucose-Capillary: 160 mg/dL — ABNORMAL HIGH (ref 65–99)
Glucose-Capillary: 145 mg/dL — ABNORMAL HIGH (ref 65–99)
Glucose-Capillary: 127 mg/dL — ABNORMAL HIGH (ref 65–99)
Glucose-Capillary: 110 mg/dL — ABNORMAL HIGH (ref 65–99)
Glucose-Capillary: 134 mg/dL — ABNORMAL HIGH (ref 65–99)

## 2015-04-04 LAB — VDRL, CSF: VDRL Quant, CSF: NONREACTIVE

## 2015-04-04 LAB — CBC
HCT: 45.5 % (ref 40.0–52.0)
Hemoglobin: 13.9 g/dL (ref 13.0–18.0)
MCH: 28.3 pg (ref 26.0–34.0)
MCHC: 30.6 g/dL — ABNORMAL LOW (ref 32.0–36.0)
MCV: 92.5 fL (ref 80.0–100.0)
Platelets: 232 10*3/uL (ref 150–440)
RBC: 4.91 MIL/uL (ref 4.40–5.90)
RDW: 15.3 % — ABNORMAL HIGH (ref 11.5–14.5)
WBC: 30.4 10*3/uL — AB (ref 3.8–10.6)

## 2015-04-04 LAB — COMPREHENSIVE METABOLIC PANEL
ALT: 73 U/L — ABNORMAL HIGH (ref 17–63)
AST: 147 U/L — ABNORMAL HIGH (ref 15–41)
Albumin: 2.1 g/dL — ABNORMAL LOW (ref 3.5–5.0)
Alkaline Phosphatase: 69 U/L (ref 38–126)
Anion gap: 6 (ref 5–15)
BUN: 31 mg/dL — ABNORMAL HIGH (ref 6–20)
CALCIUM: 8 mg/dL — AB (ref 8.9–10.3)
CO2: 20 mmol/L — AB (ref 22–32)
CREATININE: 1.17 mg/dL (ref 0.61–1.24)
Chloride: 111 mmol/L (ref 101–111)
GFR calc Af Amer: >60 mL/min (ref >60)
GFR calc non Af Amer: >60 mL/min (ref >60)
Glucose, Bld: 139 mg/dL — ABNORMAL HIGH (ref 65–99)
Potassium: 4 mmol/L (ref 3.5–5.1)
Sodium: 137 mmol/L (ref 135–145)
Total Bilirubin: 1.2 mg/dL (ref 0.3–1.2)
Total Protein: 6.1 g/dL — ABNORMAL LOW (ref 6.5–8.1)

## 2015-04-04 LAB — HERPES SIMPLEX VIRUS(HSV) DNA BY PCR
HSV 1 DNA: NEGATIVE
HSV 2 DNA: NEGATIVE

## 2015-04-04 LAB — VANCOMYCIN, TROUGH: Vancomycin Tr: 9 ug/mL — ABNORMAL LOW (ref 10–20)

## 2015-04-04 MED ORDER — DIAZEPAM 5 MG/ML IJ SOLN
INTRAMUSCULAR | Status: AC
  Administered 2015-04-04: 2 mg
  Filled 2015-04-04: qty 2

## 2015-04-04 MED ORDER — DIAZEPAM 5 MG/ML IJ SOLN
2.0000 mg | Freq: Once | INTRAMUSCULAR | Status: AC
  Administered 2015-04-04: 2 mg via INTRAVENOUS

## 2015-04-04 MED ORDER — GADOBENATE DIMEGLUMINE 529 MG/ML IV SOLN
15.0000 mL | Freq: Once | INTRAVENOUS | Status: AC | PRN
  Administered 2015-04-04: 15 mL via INTRAVENOUS

## 2015-04-04 MED ORDER — ENOXAPARIN SODIUM 40 MG/0.4ML ~~LOC~~ SOLN
40.0000 mg | SUBCUTANEOUS | Status: DC
  Administered 2015-04-04: 40 mg via SUBCUTANEOUS
  Filled 2015-04-04: qty 0.4

## 2015-04-04 MED ORDER — VANCOMYCIN HCL IN DEXTROSE 1-5 GM/200ML-% IV SOLN
1000.0000 mg | Freq: Two times a day (BID) | INTRAVENOUS | Status: DC
  Filled 2015-04-04 (×2): qty 200

## 2015-04-04 NOTE — Consult Note (Signed)
Chief Complaint: AMS  HPI: Nathan Thomas is an 69 y.o. male with no significant past medical history who presented to the emergency room with unresponsive. Per the EMS report he was found by his brother unresponsive at his home. Pt was febrile at 102, with leukocytosis at 26,000 initially s/p LP shows severe elevation of WBC, suspected of meningitis. 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     History reviewed. No pertinent past medical history.  History reviewed. No pertinent past surgical history.  History reviewed. No pertinent family history. Social History:  reports that he has been smoking Cigarettes.  He does not have any smokeless tobacco history on file. His alcohol and drug histories are not on file.  Allergies: No Known Allergies  Medications: I have reviewed the patient's current medications.  ROS: Unable to obtain due to mental status   Physical Examination: Blood pressure 149/84, pulse 91, temperature 98.3 F (36.8 C), temperature source Axillary, resp. rate 29, height 5\' 11"  (1.803 m), weight 77.5 kg (170 lb 13.7 oz), SpO2 100 %.  Opens eyes to painful stimuli Does not follow commands Does not respond to visual threates Pupils sluggish, reactive Corneal reflexes intact.    Laboratory Studies:  Basic Metabolic Panel:  Recent Labs Lab 04/02/15 1105 04/03/15 0549 04/04/15 0446  NA 138 139 137  K 3.6 3.1* 4.0  CL 100* 103 111  CO2 25 23 20*  GLUCOSE 162* 162* 139*  BUN 22* 25* 31*  CREATININE 1.20 1.18 1.17  CALCIUM 9.5 8.8* 8.0*  MG 2.2  --   --   PHOS 2.3*  --   --     Liver Function Tests:  Recent Labs Lab 04/02/15 1105 04/03/15 0549 04/04/15 0446  AST 202* 93* 147*  ALT 40 35 73*  ALKPHOS 81 70 69  BILITOT 0.5 0.6 1.2  PROT 9.2* 8.1 6.1*  ALBUMIN 3.4* 2.9* 2.1*   No results for input(s): LIPASE, AMYLASE in the last 168 hours. No results for input(s): AMMONIA in  the last 168 hours.  CBC:  Recent Labs Lab 04/02/15 1105 04/03/15 0549 04/04/15 0446  WBC 26.2* 29.9* 30.4*  NEUTROABS 23.8*  --   --   HGB 14.3 14.4 13.9  HCT 43.6 44.9 45.5  MCV 88.8 91.0 92.5  PLT 281 275 232    Cardiac Enzymes:  Recent Labs Lab 04/02/15 1105 04/02/15 1754 04/02/15 2321 04/03/15 0549  TROPONINI 0.28* 0.36* 0.52* 0.56*    BNP: Invalid input(s): POCBNP  CBG:  Recent Labs Lab 04/03/15 2359 04/04/15 0337 04/04/15 0758 04/04/15 1008 04/04/15 1215  GLUCAP 156* 145* 133* 127* 110*    Microbiology: Results for orders placed or performed during the hospital encounter of 04/02/15  Blood culture (routine x 2)     Status: None (Preliminary result)   Collection Time: 04/02/15 11:06 AM  Result Value Ref Range Status   Specimen Description BLOOD RIGHT ASSIST CONTROL  Final   Special Requests BOTTLES DRAWN AEROBIC AND ANAEROBIC 2CC  Final   Culture NO GROWTH 2 DAYS  Final   Report Status PENDING  Incomplete  Blood culture (routine x 2)     Status: None (Preliminary result)   Collection Time: 04/02/15 11:13 AM  Result Value Ref Range Status   Specimen Description BLOOD RIGHT ASSIST CONTROL  Final   Special Requests   Final    BOTTLES DRAWN AEROBIC AND ANAEROBIC ANA 3CC AER 2  CC   Culture NO GROWTH 2 DAYS  Final   Report Status PENDING  Incomplete  CSF culture     Status: None (Preliminary result)   Collection Time: 04/02/15  5:00 PM  Result Value Ref Range Status   Specimen Description CSF  Final   Special Requests Normal  Final   Gram Stain   Final    MANY POLYMORPHONUCLEAR WBC SEEN NO ORGANISMS SEEN    Culture NO GROWTH 2 DAYS  Final   Report Status PENDING  Incomplete  Culture, fungus without smear-CSF     Status: None (Preliminary result)   Collection Time: 04/02/15  5:00 PM  Result Value Ref Range Status   Specimen Description CSF  Final   Special Requests Normal  Final   Culture NO GROWTH < 24 HOURS  Final   Report Status PENDING   Incomplete  Anaerobic culture     Status: None (Preliminary result)   Collection Time: 04/02/15  5:00 PM  Result Value Ref Range Status   Specimen Description CSF  Final   Special Requests Normal  Final   Culture   Final    NO ANAEROBES ISOLATED; CULTURE IN PROGRESS FOR 5 DAYS   Report Status PENDING  Incomplete  MRSA PCR Screening     Status: None   Collection Time: 04/02/15  9:00 PM  Result Value Ref Range Status   MRSA by PCR NEGATIVE NEGATIVE Final    Comment:        The GeneXpert MRSA Assay (FDA approved for NASAL specimens only), is one component of a comprehensive MRSA colonization surveillance program. It is not intended to diagnose MRSA infection nor to guide or monitor treatment for MRSA infections.     Coagulation Studies:  Recent Labs  04/02/15 1105  LABPROT 15.4*  INR 1.20    Urinalysis:  Recent Labs Lab 04/02/15 1110 04/03/15 2219  COLORURINE YELLOW* AMBER*  LABSPEC 1.025 1.023  PHURINE 5.0 6.0  GLUCOSEU 50* 50*  HGBUR 3+* 2+*  BILIRUBINUR NEGATIVE NEGATIVE  KETONESUR TRACE* NEGATIVE  PROTEINUR >500* 100*  NITRITE NEGATIVE NEGATIVE  LEUKOCYTESUR NEGATIVE NEGATIVE    Lipid Panel: No results found for: CHOL, TRIG, HDL, CHOLHDL, VLDL, LDLCALC  HgbA1C:  Lab Results  Component Value Date   HGBA1C 6.2* 04/02/2015    Urine Drug Screen:     Component Value Date/Time   LABOPIA NONE DETECTED 04/02/2015 1110   LABBENZ NONE DETECTED 04/02/2015 1110   AMPHETMU NONE DETECTED 04/02/2015 1110   THCU POSITIVE* 04/02/2015 1110   LABBARB NONE DETECTED 04/02/2015 1110    Alcohol Level:  Recent Labs Lab 04/02/15 1105  ETH <5    Other results: EKG: normal EKG, normal sinus rhythm, unchanged from previous tracings.  Imaging: Ct Chest W Contrast  04/02/2015   ADDENDUM REPORT: 04/02/2015 14:25  ADDENDUM: Patient also has a distended scrotum which appears to be due to large bilateral hydroceles. This could be further assessed, if desired  clinically, with scrotal ultrasound. No scrotal air is seen to suggest a gas-forming infection.   Electronically Signed   By: Lajean Manes M.D.   On: 04/02/2015 14:25   04/02/2015   CLINICAL DATA:  Pt via EMS found by brother this am with AMS. Last time known well last Tuesday. Usually A&Ox4 per EMS report. Pt with abnormal chest x-ray from today and scrotal swelling noted on physical exam. Pt unable to answer any med hx questions  EXAM: CT CHEST, ABDOMEN, AND PELVIS WITH CONTRAST  TECHNIQUE: Multidetector  CT imaging of the chest, abdomen and pelvis was performed following the standard protocol during bolus administration of intravenous contrast.  CONTRAST:  169mL OMNIPAQUE IOHEXOL 300 MG/ML  SOLN  COMPARISON:  Current chest radiograph  FINDINGS: CT CHEST FINDINGS  Lungs and pleura: 4 cm mass in the right upper lobe correlates with the oval masslike opacity noted on the current chest radiograph. There is also a right upper lobe irregular patchy opacity in coarse reticular opacity. The areas of contiguous regular patchy opacity, which extend from the mass to the superior lateral right upper lobe pleural margin, may reflect additional tumor, conglomerate scarring or a combination. There also cystic areas and bronchiectasis with a prominent bleb at the right apex. Much of the coarse reticular opacities are likely due scarring. There are changes of mild centrilobular emphysema in both lungs mostly in the upper lobes. There is some dependent subsegmental atelectasis in both lower lobes. No left lung mass or suspicious nodule. No pleural effusions or pneumothorax.  Thoracic inlet: Mildly enlarged heterogeneous thyroid gland. No discrete measurable nodule. No neck base masses or adenopathy.  Mediastinum and hila: No masses or pathologically enlarged lymph nodes. Subcarinal node measures 1 cm short axis. There are several sub cm peritracheal nodes. Heart is normal in size. Great vessels are normal in caliber.  CT ABDOMEN  AND PELVIS FINDINGS  Liver: 4 mm low-density lesion centrally. 5 mm low-density lesion, suggesting a fatty lesion, is seen in the left lobe. Focal fat projects along the inferior falciform ligament. No other liver abnormalities.  Spleen, gallbladder, pancreas:  Unremarkable.  Adrenal glands. Left adrenal gland is thickened, measuring 17 mm in greatest transverse thickness. Normal right adrenal gland.  Kidneys, ureters, bladder: 5 mm low-density lesion from the lower pole left kidney consistent with a cyst. No other renal masses, no stones and no hydronephrosis. Normal ureters. Bladder is decompressed around a Foley catheter. Despite being collapse, the wall still appears thickened. No bladder mass.  Lymph nodes:  No enlarged or abnormal appearing lymph nodes.  Ascites: None.  Gastrointestinal: No bowel wall thickening or inflammatory changes. No bowel dilation is seen to suggest obstruction or adynamic ileus. No appendicitis.  MUSCULOSKELETAL  No osteoblastic or osteolytic lesions.  IMPRESSION: 1. 4 cm right upper lobe mass highly suspicious for a primary bronchogenic carcinoma. Irregular mass like opacity seen adjacent to this in the right upper lobe which may reflect conglomerate scarring, additional tumor or combination of both. Other areas of right upper lobe opacity are consistent with chronic scarring. There is an emphysematous bleb at the right apex. 2. No evidence of hilar or mediastinal metastatic disease or of left lung metastatic disease. 3. In the abdomen pelvis, the bladder wall appears thickened. Consider cystitis if there are consistent clinical symptoms. This could be a chronic finding in this patient. 4. No other evidence of an acute abnormality below the diaphragm. 5. 4 mm low-density lesion in the central liver. This may reflect a small cyst. Metastatic disease is not excluded but felt unlikely. Adjacent lower attenuation liver lesion appears to contain some fat. No other evidence to suggest  metastatic disease within the abdomen or pelvis.  Electronically Signed: By: Lajean Manes M.D. On: 04/02/2015 13:44   Mr Jeri Cos IZ Contrast  04/04/2015   CLINICAL DATA:  69 year old male with fever and altered mental status with unresponsiveness. Abnormal lumbar puncture. 4 cm right lung lobe mass. Subsequent encounter.  EXAM: MRI HEAD WITHOUT AND WITH CONTRAST  TECHNIQUE: Multiplanar, multiecho pulse sequences  of the brain and surrounding structures were obtained without and with intravenous contrast.  CONTRAST:  26mL MULTIHANCE GADOBENATE DIMEGLUMINE 529 MG/ML IV SOLN  COMPARISON:  04/02/2015 head CT and CT of the chest abdomen and pelvis.  FINDINGS: Exam is motion degraded.  Fast technique imaging had to be utilized  Numerous supratentorial rounded masses with minimal peripheral enhancement/vasogenic edema. These areas demonstrate restricted motion. Additionally, abnormal appearance of cerebral spinal fluid within the ventricles greater on the right with minimal subependymal enhancement and mild subependymal edema. This constellation of findings in addition to patient's fever, elevated white count and abnormal lumbar puncture are suspicious for intracranial abscesses, ventricular and ependymitis. This may represent an atypical infection and correlation with cerebral spinal fluid analysis recommended.  As the patient has a lung mass, if this turns out be a malignancy, cystic metastatic lesions as cause for the above described findings with secondary involvement of the ventricles may be considered but would need to be a diagnosis of exclusion after infection has been considered and excluded.  Minimal blood breakdown products posterior left temporal lobe and along the periphery of left periatrial cystic lesion.  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  Global atrophy. Currently no hydrocephalus. Patient is at risk for development of hydrocephalus, close follow-up imaging recommended.  Major intracranial  vascular structures are patent. Ectatic right internal carotid artery and right carotid terminus. Small left vertebral artery.  Cervical spondylotic changes with spinal stenosis and mild cord flattening C3-4 and C4-5.  Prominent exophthalmos.  Minimal mucosal thickening inferior aspect right maxillary sinus.  IMPRESSION: Exam is motion degraded.  Fast technique imaging had to be utilized  Numerous supratentorial rounded masses with minimal peripheral enhancement/vasogenic edema. These areas demonstrate restricted motion. Additionally, abnormal appearance of cerebral spinal fluid within the ventricles greater on the right with minimal subependymal enhancement and mild to mild subependymal edema. This constellation of findings in addition to patient's fever, elevated white count and abnormal lumbar puncture are suspicious for intracranial abscesses, ventricular and ependymitis. This may represent an atypical infection (possibly tuberculosis, fungal disease, parasitic disease) and correlation with cerebral spinal fluid analysis recommended.  As the patient has a lung mass, if this turns out be a malignancy, cystic metastatic lesions as cause for the above described findings with secondary involvement of the ventricles may be considered but would need to be a diagnosis of exclusion after infection has been considered and excluded.  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  Global atrophy. Currently no hydrocephalus. Patient is at risk for development of hydrocephalus, close follow-up imaging recommended.  These results were called by telephone at the time of interpretation on 04/04/2015 at 10:44 am to Dr. Dustin Flock , who verbally acknowledged these results.   Electronically Signed   By: Genia Del M.D.   On: 04/04/2015 11:00   Ct Abdomen Pelvis W Contrast  04/02/2015   ADDENDUM REPORT: 04/02/2015 14:25  ADDENDUM: Patient also has a distended scrotum which appears to be due to large bilateral hydroceles.  This could be further assessed, if desired clinically, with scrotal ultrasound. No scrotal air is seen to suggest a gas-forming infection.   Electronically Signed   By: Lajean Manes M.D.   On: 04/02/2015 14:25   04/02/2015   CLINICAL DATA:  Pt via EMS found by brother this am with AMS. Last time known well last Tuesday. Usually A&Ox4 per EMS report. Pt with abnormal chest x-ray from today and scrotal swelling noted on physical exam. Pt unable to  answer any med hx questions  EXAM: CT CHEST, ABDOMEN, AND PELVIS WITH CONTRAST  TECHNIQUE: Multidetector CT imaging of the chest, abdomen and pelvis was performed following the standard protocol during bolus administration of intravenous contrast.  CONTRAST:  113mL OMNIPAQUE IOHEXOL 300 MG/ML  SOLN  COMPARISON:  Current chest radiograph  FINDINGS: CT CHEST FINDINGS  Lungs and pleura: 4 cm mass in the right upper lobe correlates with the oval masslike opacity noted on the current chest radiograph. There is also a right upper lobe irregular patchy opacity in coarse reticular opacity. The areas of contiguous regular patchy opacity, which extend from the mass to the superior lateral right upper lobe pleural margin, may reflect additional tumor, conglomerate scarring or a combination. There also cystic areas and bronchiectasis with a prominent bleb at the right apex. Much of the coarse reticular opacities are likely due scarring. There are changes of mild centrilobular emphysema in both lungs mostly in the upper lobes. There is some dependent subsegmental atelectasis in both lower lobes. No left lung mass or suspicious nodule. No pleural effusions or pneumothorax.  Thoracic inlet: Mildly enlarged heterogeneous thyroid gland. No discrete measurable nodule. No neck base masses or adenopathy.  Mediastinum and hila: No masses or pathologically enlarged lymph nodes. Subcarinal node measures 1 cm short axis. There are several sub cm peritracheal nodes. Heart is normal in size. Great  vessels are normal in caliber.  CT ABDOMEN AND PELVIS FINDINGS  Liver: 4 mm low-density lesion centrally. 5 mm low-density lesion, suggesting a fatty lesion, is seen in the left lobe. Focal fat projects along the inferior falciform ligament. No other liver abnormalities.  Spleen, gallbladder, pancreas:  Unremarkable.  Adrenal glands. Left adrenal gland is thickened, measuring 17 mm in greatest transverse thickness. Normal right adrenal gland.  Kidneys, ureters, bladder: 5 mm low-density lesion from the lower pole left kidney consistent with a cyst. No other renal masses, no stones and no hydronephrosis. Normal ureters. Bladder is decompressed around a Foley catheter. Despite being collapse, the wall still appears thickened. No bladder mass.  Lymph nodes:  No enlarged or abnormal appearing lymph nodes.  Ascites: None.  Gastrointestinal: No bowel wall thickening or inflammatory changes. No bowel dilation is seen to suggest obstruction or adynamic ileus. No appendicitis.  MUSCULOSKELETAL  No osteoblastic or osteolytic lesions.  IMPRESSION: 1. 4 cm right upper lobe mass highly suspicious for a primary bronchogenic carcinoma. Irregular mass like opacity seen adjacent to this in the right upper lobe which may reflect conglomerate scarring, additional tumor or combination of both. Other areas of right upper lobe opacity are consistent with chronic scarring. There is an emphysematous bleb at the right apex. 2. No evidence of hilar or mediastinal metastatic disease or of left lung metastatic disease. 3. In the abdomen pelvis, the bladder wall appears thickened. Consider cystitis if there are consistent clinical symptoms. This could be a chronic finding in this patient. 4. No other evidence of an acute abnormality below the diaphragm. 5. 4 mm low-density lesion in the central liver. This may reflect a small cyst. Metastatic disease is not excluded but felt unlikely. Adjacent lower attenuation liver lesion appears to contain  some fat. No other evidence to suggest metastatic disease within the abdomen or pelvis.  Electronically Signed: By: Lajean Manes M.D. On: 04/02/2015 13:44   Dg Lumbar Puncture Fluoro Guide  04/03/2015   CLINICAL DATA:  69 year old male with fever, leukocytosis and altered mental status. Source of the infection the [place] far undetermined. Lumbar  puncture attempted in the emergency department by 2 separate physicians. Procedure was unsuccessful secondary to bony hypertrophy of the spinous processes. Emergent lumbar puncture under fluoroscopy is warranted.  EXAM: DIAGNOSTIC LUMBAR PUNCTURE UNDER FLUOROSCOPIC GUIDANCE  FLUOROSCOPY TIME:  If the device does not provide the exposure index:  Fluoroscopy Time (in minutes and seconds):  30 seconds  Number of Acquired Images:  0  PROCEDURE: Informed consent was obtained from the patient prior to the procedure, including potential complications of headache, allergy, and pain. With the patient prone, the lower back was prepped with Betadine. 1% Lidocaine was used for local anesthesia. Lumbar puncture was performed at the L2-L3 level using a 20 gauge needle with return of cloudy CSF with an opening pressure of 57 cm water. A total of 18 ml of CSF were obtained for laboratory studies. The patient tolerated the procedure well and there were no apparent complications.  IMPRESSION: 1. Successful L2-L3 lumbar puncture under fluoroscopic guidance. 2. Elevated opening pressure of 57 cm H2O 3. Return of cloudy CSF concerning for bacterial meningitis. A total of 18 mL CSF was obtained secondary to elevation of the pressure. Sample sent for stat Gram stain and culture. Signed,  Criselda Peaches, MD  [facility]  [facility]   Electronically Signed   By: Jacqulynn Cadet M.D.   On: 04/03/2015 08:13    Assessment: 69 y.o. male with no significant past medical history who presented to the emergency room with unresponsive. Per the EMS  report he was found by his brother unresponsive at his home. Pt was febrile at 102, with leukocytosis at 26,000 initially s/p LP shows severe elevation of WBC, suspected of meningitis. 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    - con't broad spectrum antibiotics - multiple suspected embolic infarcts/ abscess's less likely metatstasis.  - 2decho reviewed - pt likely needs TEE followed up by cancer work up - agree with transfer to bigger center   Leotis Pain   04/04/2015, 1:11 PM

## 2015-04-04 NOTE — Progress Notes (Signed)
[place] at [facility]                                                                                                                                                                                            Patient Demographics   Nathan Thomas, is a 69 y.o. male, DOB - [birth year], PPJ:[id number]  Admit date - 04/02/2015   Admitting Physician Aldean Jewett, MD  Outpatient Primary MD for the patient is No PCP Per Patient   LOS - 2  Subjective:patient was was awake and  thrashing around earlier today. But because he needed an MRI he received Valium and now patient is again poorly responsive [place] [facility]                                                                                                                                                                                            Patient Demographics   Nathan Thomas, is a 69 y.o. male, DOB - 1946-05-08, PYK:[id number]  Admit date - 04/02/2015   Admitting Physician Aldean Jewett, MD  Outpatient Primary MD for the patient is No PCP Per Patient   LOS - 2  Subjective: Patient was opening his eyes earlier and thrashing around, he had to receive Valium for the MRI and now opens his eyes slightly   Review of Systems:   Poorly responsive  Vitals:   Filed Vitals:   04/04/15 0700 04/04/15 0800 04/04/15 1000 04/04/15 1030  BP: 143/89 135/96 154/64 138/83  Pulse: 94 92 95 90  Temp:  97.9 F (36.6 C)    TempSrc:  Oral    Resp: 18 20 19 27   Height:      Weight:  SpO2: 97% 96% 100% 100%    Wt Readings from Last 3 Encounters:  04/04/15 77.5 kg (170 lb 13.7 oz)  04/04/15 77.111 kg (170 lb)     Intake/Output Summary (Last 24 hours) at 04/04/15 1113 Last data filed at 04/04/15 5956  Gross per 24 hour  Intake   4027 ml  Output   1220 ml  Net   2807 ml    Physical Exam:   GENERAL: Critically ill male HEAD, EYES, EARS, NOSE AND  THROAT: Atraumatic, normocephalic. Extraocular muscles are intact. Pupils equal and reactive to light. Sclerae anicteric. No conjunctival injection. No oro-pharyngeal erythema.  NECK: Supple. There is no jugular venous distention. No bruits, no lymphadenopathy, no thyromegaly.  HEART: Regular rate and rhythm, tachycardic. No murmurs, no rubs, no clicks.  LUNGS: Clear to auscultation bilaterally. No rales or rhonchi. No wheezes.  ABDOMEN: Soft, flat, nontender, nondistended. Has good bowel sounds. No hepatosplenomegaly appreciated.  EXTREMITIES: No evidence of any cyanosis, clubbing, or peripheral edema.  +2 pedal and radial pulses bilaterally.  NEUROLOGIC: Lethargic hard to arouse despite sternal rub SKIN: Moist and warm with no rashes appreciated.  Psych: Poorly responsive LN: No inguinal LN enlargement    Antibiotics   Anti-infectives    Start     Dose/Rate Route Frequency Ordered Stop   04/03/15 1830  vancomycin (VANCOCIN) IVPB 1000 mg/200 mL premix     1,000 mg 200 mL/hr over 60 Minutes Intravenous Every 12 hours 04/02/15 1800     04/02/15 2300  acyclovir (ZOVIRAX) 850 mg in dextrose 5 % 150 mL IVPB     850 mg 167 mL/hr over 60 Minutes Intravenous 3 times per day 04/02/15 1800     04/02/15 1630  vancomycin (VANCOCIN) IVPB 1000 mg/200 mL premix  Status:  Discontinued     1,000 mg 200 mL/hr over 60 Minutes Intravenous Every 12 hours 04/02/15 1800 04/02/15 1800   04/02/15 1515  acyclovir (ZOVIRAX) 860 mg in dextrose 5 % 150 mL IVPB     10 mg/kg  86.2 kg 167.2 mL/hr over 60 Minutes Intravenous  Once 04/02/15 1504 04/04/15 0018   04/02/15 1400  cefTAZidime (FORTAZ) 2 g in dextrose 5 % 50 mL IVPB     2 g 100 mL/hr over 30 Minutes Intravenous 3 times per day 04/02/15 1105     04/02/15 1115  vancomycin (VANCOCIN) IVPB 1000 mg/200 mL premix     1,000 mg 200 mL/hr over 60 Minutes Intravenous  Once 04/02/15 1104 04/02/15 1215      Medications   Scheduled Meds: . acyclovir  850 mg  Intravenous 3 times per day  . aspirin  300 mg Rectal Daily  . cefTAZidime (FORTAZ)  IV  2 g Intravenous 3 times per day  . enoxaparin (LOVENOX) injection  40 mg Subcutaneous Q24H  . folic acid  1 mg Intravenous Daily  . metoprolol  5 mg Intravenous 4 times per day  . nitroGLYCERIN  0.5 inch Topical 4 times per day  . thiamine  100 mg Intravenous Daily  . vancomycin  1,000 mg Intravenous Q12H   Continuous Infusions: . sodium chloride 100 mL/hr at 04/03/15 2126  . niCARDipine Stopped (04/04/15 3875)   PRN Meds:.acetaminophen **OR** acetaminophen, hydrALAZINE   Data Review:   Micro Results Recent Results (from the past 240 hour(s))  Blood culture (routine x 2)     Status: None (Preliminary result)   Collection Time: 04/02/15 11:06 AM  Result Value Ref Range Status   Specimen Description  BLOOD RIGHT ASSIST CONTROL  Final   Special Requests BOTTLES DRAWN AEROBIC AND ANAEROBIC 2CC  Final   Culture NO GROWTH 2 DAYS  Final   Report Status PENDING  Incomplete  Blood culture (routine x 2)     Status: None (Preliminary result)   Collection Time: 04/02/15 11:13 AM  Result Value Ref Range Status   Specimen Description BLOOD RIGHT ASSIST CONTROL  Final   Special Requests   Final    BOTTLES DRAWN AEROBIC AND ANAEROBIC ANA 3CC AER 2 CC   Culture NO GROWTH 2 DAYS  Final   Report Status PENDING  Incomplete  CSF culture     Status: None (Preliminary result)   Collection Time: 04/02/15  5:00 PM  Result Value Ref Range Status   Specimen Description CSF  Final   Special Requests Normal  Final   Gram Stain   Final    MANY POLYMORPHONUCLEAR WBC SEEN NO ORGANISMS SEEN    Culture NO GROWTH 2 DAYS  Final   Report Status PENDING  Incomplete  Culture, fungus without smear-CSF     Status: None (Preliminary result)   Collection Time: 04/02/15  5:00 PM  Result Value Ref Range Status   Specimen Description CSF  Final   Special Requests Normal  Final   Culture NO GROWTH < 24 HOURS  Final   Report  Status PENDING  Incomplete  Anaerobic culture     Status: None (Preliminary result)   Collection Time: 04/02/15  5:00 PM  Result Value Ref Range Status   Specimen Description CSF  Final   Special Requests Normal  Final   Culture   Final    NO ANAEROBES ISOLATED; CULTURE IN PROGRESS FOR 5 DAYS   Report Status PENDING  Incomplete  MRSA PCR Screening     Status: None   Collection Time: 04/02/15  9:00 PM  Result Value Ref Range Status   MRSA by PCR NEGATIVE NEGATIVE Final    Comment:        The GeneXpert MRSA Assay (FDA approved for NASAL specimens only), is one component of a comprehensive MRSA colonization surveillance program. It is not intended to diagnose MRSA infection nor to guide or monitor treatment for MRSA infections.     Radiology Reports Ct Head Wo Contrast  04/02/2015   CLINICAL DATA:  Pt via EMS found by brother this am with AMS. Last time known well last Tuesday. Usually A&Ox4 per EMS report. Pt uncooperative at this time. Agitated.  EXAM: CT HEAD WITHOUT CONTRAST  TECHNIQUE: Contiguous axial images were obtained from the base of the skull through the vertex without intravenous contrast.  COMPARISON:  None  FINDINGS: Ventricles are normal in configuration. There is mild, age related, ventricular and sulcal enlargement. No hydrocephalus.  There are no parenchymal masses or mass effect. There is no evidence of a recent infarct. Small area of hypoattenuation the left cerebellum consistent with an old infarct.  There are no extra-axial masses or abnormal fluid collections.  There is no intracranial hemorrhage.  Visualized sinuses and mastoid air cells are clear. No skull fracture or lesion.  IMPRESSION: 1. No acute intracranial abnormalities. 2. Age related volume loss.  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   Electronically Signed   By: Lajean Manes M.D.   On: 04/02/2015 11:55   Ct Chest W Contrast  04/02/2015   ADDENDUM REPORT: 04/02/2015 14:25  ADDENDUM: Patient also has a  distended scrotum which appears to be due to large bilateral hydroceles. This  could be further assessed, if desired clinically, with scrotal ultrasound. No scrotal air is seen to suggest a gas-forming infection.   Electronically Signed   By: Lajean Manes M.D.   On: 04/02/2015 14:25   04/02/2015   CLINICAL DATA:  Pt via EMS found by brother this am with AMS. Last time known well last Tuesday. Usually A&Ox4 per EMS report. Pt with abnormal chest x-ray from today and scrotal swelling noted on physical exam. Pt unable to answer any med hx questions  EXAM: CT CHEST, ABDOMEN, AND PELVIS WITH CONTRAST  TECHNIQUE: Multidetector CT imaging of the chest, abdomen and pelvis was performed following the standard protocol during bolus administration of intravenous contrast.  CONTRAST:  161mL OMNIPAQUE IOHEXOL 300 MG/ML  SOLN  COMPARISON:  Current chest radiograph  FINDINGS: CT CHEST FINDINGS  Lungs and pleura: 4 cm mass in the right upper lobe correlates with the oval masslike opacity noted on the current chest radiograph. There is also a right upper lobe irregular patchy opacity in coarse reticular opacity. The areas of contiguous regular patchy opacity, which extend from the mass to the superior lateral right upper lobe pleural margin, may reflect additional tumor, conglomerate scarring or a combination. There also cystic areas and bronchiectasis with a prominent bleb at the right apex. Much of the coarse reticular opacities are likely due scarring. There are changes of mild centrilobular emphysema in both lungs mostly in the upper lobes. There is some dependent subsegmental atelectasis in both lower lobes. No left lung mass or suspicious nodule. No pleural effusions or pneumothorax.  Thoracic inlet: Mildly enlarged heterogeneous thyroid gland. No discrete measurable nodule. No neck base masses or adenopathy.  Mediastinum and hila: No masses or pathologically enlarged lymph nodes. Subcarinal node measures 1 cm short axis.  There are several sub cm peritracheal nodes. Heart is normal in size. Great vessels are normal in caliber.  CT ABDOMEN AND PELVIS FINDINGS  Liver: 4 mm low-density lesion centrally. 5 mm low-density lesion, suggesting a fatty lesion, is seen in the left lobe. Focal fat projects along the inferior falciform ligament. No other liver abnormalities.  Spleen, gallbladder, pancreas:  Unremarkable.  Adrenal glands. Left adrenal gland is thickened, measuring 17 mm in greatest transverse thickness. Normal right adrenal gland.  Kidneys, ureters, bladder: 5 mm low-density lesion from the lower pole left kidney consistent with a cyst. No other renal masses, no stones and no hydronephrosis. Normal ureters. Bladder is decompressed around a Foley catheter. Despite being collapse, the wall still appears thickened. No bladder mass.  Lymph nodes:  No enlarged or abnormal appearing lymph nodes.  Ascites: None.  Gastrointestinal: No bowel wall thickening or inflammatory changes. No bowel dilation is seen to suggest obstruction or adynamic ileus. No appendicitis.  MUSCULOSKELETAL  No osteoblastic or osteolytic lesions.  IMPRESSION: 1. 4 cm right upper lobe mass highly suspicious for a primary bronchogenic carcinoma. Irregular mass like opacity seen adjacent to this in the right upper lobe which may reflect conglomerate scarring, additional tumor or combination of both. Other areas of right upper lobe opacity are consistent with chronic scarring. There is an emphysematous bleb at the right apex. 2. No evidence of hilar or mediastinal metastatic disease or of left lung metastatic disease. 3. In the abdomen pelvis, the bladder wall appears thickened. Consider cystitis if there are consistent clinical symptoms. This could be a chronic finding in this patient. 4. No other evidence of an acute abnormality below the diaphragm. 5. 4 mm low-density lesion in the  central liver. This may reflect a small cyst. Metastatic disease is not excluded but  felt unlikely. Adjacent lower attenuation liver lesion appears to contain some fat. No other evidence to suggest metastatic disease within the abdomen or pelvis.  Electronically Signed: By: Lajean Manes M.D. On: 04/02/2015 13:44   Mr Jeri Cos ZS Contrast  04/04/2015   CLINICAL DATA:  69 year old male with fever and altered mental status with unresponsiveness. Abnormal lumbar puncture. 4 cm right lung lobe mass. Subsequent encounter.  EXAM: MRI HEAD WITHOUT AND WITH CONTRAST  TECHNIQUE: Multiplanar, multiecho pulse sequences of the brain and surrounding structures were obtained without and with intravenous contrast.  CONTRAST:  59mL MULTIHANCE GADOBENATE DIMEGLUMINE 529 MG/ML IV SOLN  COMPARISON:  04/02/2015 head CT and CT of the chest abdomen and pelvis.  FINDINGS: Exam is motion degraded.  Fast technique imaging had to be utilized  Numerous supratentorial rounded masses with minimal peripheral enhancement/vasogenic edema. These areas demonstrate restricted motion. Additionally, abnormal appearance of cerebral spinal fluid within the ventricles greater on the right with minimal subependymal enhancement and mild subependymal edema. This constellation of findings in addition to patient's fever, elevated white count and abnormal lumbar puncture are suspicious for intracranial abscesses, ventricular and ependymitis. This may represent an atypical infection and correlation with cerebral spinal fluid analysis recommended.  As the patient has a lung mass, if this turns out be a malignancy, cystic metastatic lesions as cause for the above described findings with secondary involvement of the ventricles may be considered but would need to be a diagnosis of exclusion after infection has been considered and excluded.  Minimal blood breakdown products posterior left temporal lobe and along the periphery of left periatrial cystic lesion.  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  Global atrophy. Currently no  hydrocephalus. Patient is at risk for development of hydrocephalus, close follow-up imaging recommended.  Major intracranial vascular structures are patent. Ectatic right internal carotid artery and right carotid terminus. Small left vertebral artery.  Cervical spondylotic changes with spinal stenosis and mild cord flattening C3-4 and C4-5.  Prominent exophthalmos.  Minimal mucosal thickening inferior aspect right maxillary sinus.  IMPRESSION: Exam is motion degraded.  Fast technique imaging had to be utilized  Numerous supratentorial rounded masses with minimal peripheral enhancement/vasogenic edema. These areas demonstrate restricted motion. Additionally, abnormal appearance of cerebral spinal fluid within the ventricles greater on the right with minimal subependymal enhancement and mild to mild subependymal edema. This constellation of findings in addition to patient's fever, elevated white count and abnormal lumbar puncture are suspicious for intracranial abscesses, ventricular and ependymitis. This may represent an atypical infection (possibly tuberculosis, fungal disease, parasitic disease) and correlation with cerebral spinal fluid analysis recommended.  As the patient has a lung mass, if this turns out be a malignancy, cystic metastatic lesions as cause for the above described findings with secondary involvement of the ventricles may be considered but would need to be a diagnosis of exclusion after infection has been considered and excluded.  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  Global atrophy. Currently no hydrocephalus. Patient is at risk for development of hydrocephalus, close follow-up imaging recommended.  These results were called by telephone at the time of interpretation on 04/04/2015 at 10:44 am to Dr. Dustin Flock , who verbally acknowledged these results.   Electronically Signed   By: Genia Del M.D.   On: 04/04/2015 11:00   Ct Abdomen Pelvis W Contrast  04/02/2015   ADDENDUM  REPORT: 04/02/2015 14:25  ADDENDUM: Patient also has a distended scrotum which appears to be due to large bilateral hydroceles. This could be further assessed, if desired clinically, with scrotal ultrasound. No scrotal air is seen to suggest a gas-forming infection.   Electronically Signed   By: Lajean Manes M.D.   On: 04/02/2015 14:25   04/02/2015   CLINICAL DATA:  Pt via EMS found by brother this am with AMS. Last time known well last Tuesday. Usually A&Ox4 per EMS report. Pt with abnormal chest x-ray from today and scrotal swelling noted on physical exam. Pt unable to answer any med hx questions  EXAM: CT CHEST, ABDOMEN, AND PELVIS WITH CONTRAST  TECHNIQUE: Multidetector CT imaging of the chest, abdomen and pelvis was performed following the standard protocol during bolus administration of intravenous contrast.  CONTRAST:  120mL OMNIPAQUE IOHEXOL 300 MG/ML  SOLN  COMPARISON:  Current chest radiograph  FINDINGS: CT CHEST FINDINGS  Lungs and pleura: 4 cm mass in the right upper lobe correlates with the oval masslike opacity noted on the current chest radiograph. There is also a right upper lobe irregular patchy opacity in coarse reticular opacity. The areas of contiguous regular patchy opacity, which extend from the mass to the superior lateral right upper lobe pleural margin, may reflect additional tumor, conglomerate scarring or a combination. There also cystic areas and bronchiectasis with a prominent bleb at the right apex. Much of the coarse reticular opacities are likely due scarring. There are changes of mild centrilobular emphysema in both lungs mostly in the upper lobes. There is some dependent subsegmental atelectasis in both lower lobes. No left lung mass or suspicious nodule. No pleural effusions or pneumothorax.  Thoracic inlet: Mildly enlarged heterogeneous thyroid gland. No discrete measurable nodule. No neck base masses or adenopathy.  Mediastinum and hila: No masses or pathologically enlarged  lymph nodes. Subcarinal node measures 1 cm short axis. There are several sub cm peritracheal nodes. Heart is normal in size. Great vessels are normal in caliber.  CT ABDOMEN AND PELVIS FINDINGS  Liver: 4 mm low-density lesion centrally. 5 mm low-density lesion, suggesting a fatty lesion, is seen in the left lobe. Focal fat projects along the inferior falciform ligament. No other liver abnormalities.  Spleen, gallbladder, pancreas:  Unremarkable.  Adrenal glands. Left adrenal gland is thickened, measuring 17 mm in greatest transverse thickness. Normal right adrenal gland.  Kidneys, ureters, bladder: 5 mm low-density lesion from the lower pole left kidney consistent with a cyst. No other renal masses, no stones and no hydronephrosis. Normal ureters. Bladder is decompressed around a Foley catheter. Despite being collapse, the wall still appears thickened. No bladder mass.  Lymph nodes:  No enlarged or abnormal appearing lymph nodes.  Ascites: None.  Gastrointestinal: No bowel wall thickening or inflammatory changes. No bowel dilation is seen to suggest obstruction or adynamic ileus. No appendicitis.  MUSCULOSKELETAL  No osteoblastic or osteolytic lesions.  IMPRESSION: 1. 4 cm right upper lobe mass highly suspicious for a primary bronchogenic carcinoma. Irregular mass like opacity seen adjacent to this in the right upper lobe which may reflect conglomerate scarring, additional tumor or combination of both. Other areas of right upper lobe opacity are consistent with chronic scarring. There is an emphysematous bleb at the right apex. 2. No evidence of hilar or mediastinal metastatic disease or of left lung metastatic disease. 3. In the abdomen pelvis, the bladder wall appears thickened. Consider cystitis if there are consistent clinical symptoms. This could be a chronic finding in this patient. 4.  No other evidence of an acute abnormality below the diaphragm. 5. 4 mm low-density lesion in the central liver. This may  reflect a small cyst. Metastatic disease is not excluded but felt unlikely. Adjacent lower attenuation liver lesion appears to contain some fat. No other evidence to suggest metastatic disease within the abdomen or pelvis.  Electronically Signed: By: Lajean Manes M.D. On: 04/02/2015 13:44   Dg Chest Portable 1 View  04/02/2015   CLINICAL DATA:  Pt brought in today by EMS for AMS and agitated pt with some shob.  EXAM: PORTABLE CHEST - 1 VIEW  COMPARISON:  None.  FINDINGS: 4.2 cm round opacity projects in the right mid lung. There is more heterogeneous airspace and coarse reticular type opacity above this in the right upper lobe. The right apex there is an emphysematous bleb. Left lung is clear.  Cardiac silhouette is normal in size. Normal mediastinal and left hilar contours. Right hilum is partly obscured by contiguous right upper lobe interstitial type opacities. No obvious hilar mass.  No convincing pleural effusion and no pneumothorax.  Bony thorax is intact.  IMPRESSION: 1. 4.2 cm masslike opacity in the right mid lung. This is concerning for malignancy. 2. Coarse reticular and patchy airspace opacity noted in the right upper lobe. This could reflect infection. 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 3. Recommend follow-up chest CT with contrast.   Electronically Signed   By: Lajean Manes M.D.   On: 04/02/2015 12:04   Dg Lumbar Puncture Fluoro Guide  04/03/2015   CLINICAL DATA:  69 year old male with fever, leukocytosis and altered mental status. Source of the infection the [place] far undetermined. Lumbar puncture attempted in the emergency department by 2 separate physicians. Procedure was unsuccessful secondary to bony hypertrophy of the spinous processes. Emergent lumbar puncture under fluoroscopy is warranted.  EXAM: DIAGNOSTIC LUMBAR PUNCTURE UNDER FLUOROSCOPIC GUIDANCE  FLUOROSCOPY TIME:  If the device does not provide the exposure index:  Fluoroscopy Time (in minutes and seconds):  30 seconds  Number of  Acquired Images:  0  PROCEDURE: Informed consent was obtained from the patient prior to the procedure, including potential complications of headache, allergy, and pain. With the patient prone, the lower back was prepped with Betadine. 1% Lidocaine was used for local anesthesia. Lumbar puncture was performed at the L2-L3 level using a 20 gauge needle with return of cloudy CSF with an opening pressure of 57 cm water. A total of 18 ml of CSF were obtained for laboratory studies. The patient tolerated the procedure well and there were no apparent complications.  IMPRESSION: 1. Successful L2-L3 lumbar puncture under fluoroscopic guidance. 2. Elevated opening pressure of 57 cm H2O 3. Return of cloudy CSF concerning for bacterial meningitis. A total of 18 mL CSF was obtained secondary to elevation of the pressure. Sample sent for stat Gram stain and culture. Signed,  Criselda Peaches, MD  [facility]  [facility]   Electronically Signed   By: Jacqulynn Cadet M.D.   On: 04/03/2015 08:13     CBC  Recent Labs Lab 04/02/15 1105 04/03/15 0549 04/04/15 0446  WBC 26.2* 29.9* 30.4*  HGB 14.3 14.4 13.9  HCT 43.6 44.9 45.5  PLT 281 275 232  MCV 88.8 91.0 92.5  MCH 29.0 29.2 28.3  MCHC 32.7 32.1 30.6*  RDW 14.9* 14.8* 15.3*  LYMPHSABS 0.9*  --   --   MONOABS 1.5*  --   --   EOSABS 0.0  --   --  BASOSABS 0.0  --   --     Chemistries   Recent Labs Lab 04/02/15 1105 04/03/15 0549 04/04/15 0446  NA 138 139 137  K 3.6 3.1* 4.0  CL 100* 103 111  CO2 25 23 20*  GLUCOSE 162* 162* 139*  BUN 22* 25* 31*  CREATININE 1.20 1.18 1.17  CALCIUM 9.5 8.8* 8.0*  MG 2.2  --   --   AST 202* 93* 147*  ALT 40 35 73*  ALKPHOS 81 70 69  BILITOT 0.5 0.6 1.2   ------------------------------------------------------------------------------------------------------------------ estimated creatinine clearance is 63.5 mL/min (by C-G formula based on Cr of  1.17). ------------------------------------------------------------------------------------------------------------------  Recent Labs  04/02/15 1105  HGBA1C 6.2*   ------------------------------------------------------------------------------------------------------------------ No results for input(s): CHOL, HDL, LDLCALC, TRIG, CHOLHDL, LDLDIRECT in the last 72 hours. ------------------------------------------------------------------------------------------------------------------  Recent Labs  04/02/15 1105  TSH 1.793   ------------------------------------------------------------------------------------------------------------------ No results for input(s): VITAMINB12, FOLATE, FERRITIN, TIBC, IRON, RETICCTPCT in the last 72 hours.  Coagulation profile  Recent Labs Lab 04/02/15 1105  INR 1.20    No results for input(s): DDIMER in the last 72 hours.  Cardiac Enzymes  Recent Labs Lab 04/02/15 1754 04/02/15 2321 04/03/15 0549  TROPONINI 0.36* 0.52* 0.56*   ------------------------------------------------------------------------------------------------------------------ Invalid input(s): POCBNP    Assessment & Plan   69 year old male with no known past medical history not on any prescribed medications found with altered mental status at home this morning. Last known normal 5 days ago. Currently febrile at 102, leukocytosis at 26K, hypertensive, minimally responsive.  #1 fever and leukocytosis: due to venriculitits, cx neg from csf, continue iv vancomycin continue cefriaxone and acylovir  # 2 malignant hypertension: continue Nitropatch and scheduled metoprolol,   #3 elevated troponin: 0.28 likely due to demand ischemia echo with normal ef  #4 hyperglycemia: Possible reactive  #5 transaminitis: AST at 202 ALT 40 albumin decreased at 3.4. Hepatitis panel pending .  #6 urine drug screen positive for cannabinoids.   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 No sign of skin infection. No significant discomfort or signs of pain. 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   Dustin Flock M.D on 04/04/2015 at 11:13 AM  Between 7am to 6pm - Pager - [phone]  After 6pm go to [email or web address] - password [email or web address] [facility]   Office  [phone]

## 2015-04-04 NOTE — Progress Notes (Signed)
ANTIBIOTIC CONSULT NOTE - FOLLOW UP   Pharmacy Consult for Acyclovir and Vancomycin Dosing  Indication: Fever/Leukocytosis/Possible Meningitis  No Known Allergies  Patient Measurements: Height: 5\' 11"  (180.3 cm) Weight: 170 lb 13.7 oz (77.5 kg) IBW/kg (Calculated) : 75.3 Adjusted Body Weight: 80kg  Vital Signs: Temp: 98.9 F (37.2 C) (07/18 0400) Temp Source: Axillary (07/18 0400) BP: 143/89 mmHg (07/18 0700) Pulse Rate: 94 (07/18 0700) Intake/Output from previous day: 07/17 0701 - 07/18 0700 In: 4027 [I.V.:2842; IV Piggyback:1185] Out: 2220 [Urine:2220] Intake/Output from this shift:    Labs:  Recent Labs  04/02/15 1105 04/03/15 0549 04/04/15 0446  WBC 26.2* 29.9* 30.4*  HGB 14.3 14.4 13.9  PLT 281 275 232  CREATININE 1.20 1.18 1.17   Estimated Creatinine Clearance: 63.5 mL/min (by C-G formula based on Cr of 1.17).  Recent Labs  04/04/15 0650  [facility] 9*     Microbiology: Recent Results (from the past 720 hour(s))  Blood culture (routine x 2)     Status: None (Preliminary result)   Collection Time: 04/02/15 11:06 AM  Result Value Ref Range Status   Specimen Description BLOOD RIGHT ASSIST CONTROL  Final   Special Requests BOTTLES DRAWN AEROBIC AND ANAEROBIC 2CC  Final   Culture NO GROWTH 2 DAYS  Final   Report Status PENDING  Incomplete  Blood culture (routine x 2)     Status: None (Preliminary result)   Collection Time: 04/02/15 11:13 AM  Result Value Ref Range Status   Specimen Description BLOOD RIGHT ASSIST CONTROL  Final   Special Requests   Final    BOTTLES DRAWN AEROBIC AND ANAEROBIC ANA 3CC AER 2 CC   Culture NO GROWTH 2 DAYS  Final   Report Status PENDING  Incomplete  CSF culture     Status: None (Preliminary result)   Collection Time: 04/02/15  5:00 PM  Result Value Ref Range Status   Specimen Description CSF  Final   Special Requests Normal  Final   Gram Stain   Final    MANY POLYMORPHONUCLEAR WBC SEEN NO ORGANISMS SEEN    Culture  NO GROWTH < 24 HOURS  Final   Report Status PENDING  Incomplete  Culture, fungus without smear-CSF     Status: None (Preliminary result)   Collection Time: 04/02/15  5:00 PM  Result Value Ref Range Status   Specimen Description CSF  Final   Special Requests Normal  Final   Culture NO GROWTH < 24 HOURS  Final   Report Status PENDING  Incomplete  MRSA PCR Screening     Status: None   Collection Time: 04/02/15  9:00 PM  Result Value Ref Range Status   MRSA by PCR NEGATIVE NEGATIVE Final    Comment:        The GeneXpert MRSA Assay (FDA approved for NASAL specimens only), is one component of a comprehensive MRSA colonization surveillance program. It is not intended to diagnose MRSA infection nor to guide or monitor treatment for MRSA infections.     Medical History: History reviewed. No pertinent past medical history.  Medications:  Scheduled:  . acyclovir  850 mg Intravenous 3 times per day  . aspirin  300 mg Rectal Daily  . cefTAZidime (FORTAZ)  IV  2 g Intravenous 3 times per day  . folic acid  1 mg Intravenous Daily  . metoprolol  5 mg Intravenous 4 times per day  . nitroGLYCERIN  0.5 inch Topical 4 times per day  . thiamine  100 mg  Intravenous Daily  . vancomycin  1,000 mg Intravenous Q12H   Infusions:  . sodium chloride 100 mL/hr at 04/03/15 2126  . niCARDipine Stopped (04/04/15 7846)   Assessment: Pharmacy consulted to dose vancomycin and acyclovir for 69 yo male being treated for possible meningitis, admitted with fever and leukocytosis. Patient was found unresponsive. Patient is ordered ceftazadime 2g IV Q8hr and received vancomycin and acyclovir in ED on 7/16.    Patient did not receive Vancomycin dose on 7/16 @ 18:00 or 7/17 @ 06:00. Level resulted @ 9 mcg/ml not useful due to patient only receiving 2 doses prior to the level.   Goal of Therapy:  Vancomycin trough level 15-20 mcg/ml  Plan:   1. Acyclovir: Will continue patient on acyclovir 850mg (~10mg /kg)  IV Q8hr.   2. Vancomycin: Informed RN to continue current dose of Vancomycin 1 g IV q12 hours. Will order Vancomycin level prior to the pm dose on 7/19.   Pharmacy will continue to monitor and adjust per consult.   Maebelle Sulton D 04/04/2015,7:57 AM

## 2015-04-04 NOTE — Consult Note (Signed)
[facility] Consultation Note  Patient ID: Nathan Thomas, MRN: [id number], DOB/AGE: 09-21-1945 69 y.o. Admit date: 04/02/2015   Date of Consult: 04/04/2015 Primary Physician: No PCP Per Patient Primary Cardiologist: None  Chief Complaint:  Chief Complaint  Patient presents with  . Altered Mental Status   Reason for Consult: essential hypertension with elevated troponin  HPI: 69 y.o. male with no evidence of significant previous cardiovascular history with essential hypertension who is found at his house with the lead down at that time the patient was brought to the emergency room and had some tachycardia most consistent with illness. He has had an infection which appears to be meningitis and has had some slight recovery from this although still has some mild sinus tachycardia. EKG showed sinus tachycardia and right bundle branch block. The patient also has had an elevated troponin of 0.28 more consistent with demand ischemia rather than acute coronary syndrome from his illness  History reviewed. No pertinent past medical history.    Surgical History: History reviewed. No pertinent past surgical history.   Home Meds: Prior to Admission medications   Not on File    Inpatient Medications:  . acyclovir  850 mg Intravenous 3 times per day  . aspirin  300 mg Rectal Daily  . cefTAZidime (FORTAZ)  IV  2 g Intravenous 3 times per day  . enoxaparin (LOVENOX) injection  40 mg Subcutaneous Q24H  . folic acid  1 mg Intravenous Daily  . metoprolol  5 mg Intravenous 4 times per day  . nitroGLYCERIN  0.5 inch Topical 4 times per day  . thiamine  100 mg Intravenous Daily  . vancomycin  1,000 mg Intravenous Q12H   . sodium chloride 100 mL/hr at 04/04/15 1202    Allergies: No Known Allergies  History   Social History  . Marital Status: Single    Spouse Name: N/A  . Number of Children: N/A  . Years of Education: N/A   Occupational History  . Not on file.   Social History  Main Topics  . Smoking status: Current Every Day Smoker    Types: Cigarettes  . Smokeless tobacco: Not on file  . Alcohol Use: Not on file  . Drug Use: Not on file  . Sexual Activity: Not on file   Other Topics Concern  . Not on file   Social History Narrative  . No narrative on file     History reviewed. No pertinent family history.   Review of Systems  Unable to assess review of systems Labs:  Recent Labs  04/02/15 1105 04/02/15 1754 04/02/15 2321 04/03/15 0549  TROPONINI 0.28* 0.36* 0.52* 0.56*   Lab Results  Component Value Date   WBC 30.4* 04/04/2015   HGB 13.9 04/04/2015   HCT 45.5 04/04/2015   MCV 92.5 04/04/2015   PLT 232 04/04/2015    Recent Labs Lab 04/04/15 0446  NA 137  K 4.0  CL 111  CO2 20*  BUN 31*  CREATININE 1.17  CALCIUM 8.0*  PROT 6.1*  BILITOT 1.2  ALKPHOS 69  ALT 73*  AST 147*  GLUCOSE 139*   No results found for: CHOL, HDL, LDLCALC, TRIG No results found for: DDIMER  Radiology/Studies:  Ct Head Wo Contrast  04/02/2015   CLINICAL DATA:  Pt via EMS found by brother this am with AMS. Last time known well last Tuesday. Usually A&Ox4 per EMS report. Pt uncooperative at this time. Agitated.  EXAM: CT HEAD WITHOUT CONTRAST  TECHNIQUE: Contiguous  axial images were obtained from the base of the skull through the vertex without intravenous contrast.  COMPARISON:  None  FINDINGS: Ventricles are normal in configuration. There is mild, age related, ventricular and sulcal enlargement. No hydrocephalus.  There are no parenchymal masses or mass effect. There is no evidence of a recent infarct. Small area of hypoattenuation the left cerebellum consistent with an old infarct.  There are no extra-axial masses or abnormal fluid collections.  There is no intracranial hemorrhage.  Visualized sinuses and mastoid air cells are clear. No skull fracture or lesion.  IMPRESSION: 1. No acute intracranial abnormalities. 2. Age related volume loss.  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   Electronically Signed   By: Lajean Manes M.D.   On: 04/02/2015 11:55   Ct Chest W Contrast  04/02/2015   ADDENDUM REPORT: 04/02/2015 14:25  ADDENDUM: Patient also has a distended scrotum which appears to be due to large bilateral hydroceles. This could be further assessed, if desired clinically, with scrotal ultrasound. No scrotal air is seen to suggest a gas-forming infection.   Electronically Signed   By: Lajean Manes M.D.   On: 04/02/2015 14:25   04/02/2015   CLINICAL DATA:  Pt via EMS found by brother this am with AMS. Last time known well last Tuesday. Usually A&Ox4 per EMS report. Pt with abnormal chest x-ray from today and scrotal swelling noted on physical exam. Pt unable to answer any med hx questions  EXAM: CT CHEST, ABDOMEN, AND PELVIS WITH CONTRAST  TECHNIQUE: Multidetector CT imaging of the chest, abdomen and pelvis was performed following the standard protocol during bolus administration of intravenous contrast.  CONTRAST:  158mL OMNIPAQUE IOHEXOL 300 MG/ML  SOLN  COMPARISON:  Current chest radiograph  FINDINGS: CT CHEST FINDINGS  Lungs and pleura: 4 cm mass in the right upper lobe correlates with the oval masslike opacity noted on the current chest radiograph. There is also a right upper lobe irregular patchy opacity in coarse reticular opacity. The areas of contiguous regular patchy opacity, which extend from the mass to the superior lateral right upper lobe pleural margin, may reflect additional tumor, conglomerate scarring or a combination. There also cystic areas and bronchiectasis with a prominent bleb at the right apex. Much of the coarse reticular opacities are likely due scarring. There are changes of mild centrilobular emphysema in both lungs mostly in the upper lobes. There is some dependent subsegmental atelectasis in both lower lobes. No left lung mass or suspicious nodule. No pleural effusions or pneumothorax.  Thoracic inlet: Mildly enlarged heterogeneous  thyroid gland. No discrete measurable nodule. No neck base masses or adenopathy.  Mediastinum and hila: No masses or pathologically enlarged lymph nodes. Subcarinal node measures 1 cm short axis. There are several sub cm peritracheal nodes. Heart is normal in size. Great vessels are normal in caliber.  CT ABDOMEN AND PELVIS FINDINGS  Liver: 4 mm low-density lesion centrally. 5 mm low-density lesion, suggesting a fatty lesion, is seen in the left lobe. Focal fat projects along the inferior falciform ligament. No other liver abnormalities.  Spleen, gallbladder, pancreas:  Unremarkable.  Adrenal glands. Left adrenal gland is thickened, measuring 17 mm in greatest transverse thickness. Normal right adrenal gland.  Kidneys, ureters, bladder: 5 mm low-density lesion from the lower pole left kidney consistent with a cyst. No other renal masses, no stones and no hydronephrosis. Normal ureters. Bladder is decompressed around a Foley catheter. Despite being collapse, the wall still appears thickened. No bladder mass.  Lymph nodes:  No enlarged or abnormal appearing lymph nodes.  Ascites: None.  Gastrointestinal: No bowel wall thickening or inflammatory changes. No bowel dilation is seen to suggest obstruction or adynamic ileus. No appendicitis.  MUSCULOSKELETAL  No osteoblastic or osteolytic lesions.  IMPRESSION: 1. 4 cm right upper lobe mass highly suspicious for a primary bronchogenic carcinoma. Irregular mass like opacity seen adjacent to this in the right upper lobe which may reflect conglomerate scarring, additional tumor or combination of both. Other areas of right upper lobe opacity are consistent with chronic scarring. There is an emphysematous bleb at the right apex. 2. No evidence of hilar or mediastinal metastatic disease or of left lung metastatic disease. 3. In the abdomen pelvis, the bladder wall appears thickened. Consider cystitis if there are consistent clinical symptoms. This could be a chronic finding in  this patient. 4. No other evidence of an acute abnormality below the diaphragm. 5. 4 mm low-density lesion in the central liver. This may reflect a small cyst. Metastatic disease is not excluded but felt unlikely. Adjacent lower attenuation liver lesion appears to contain some fat. No other evidence to suggest metastatic disease within the abdomen or pelvis.  Electronically Signed: By: Lajean Manes M.D. On: 04/02/2015 13:44   Mr Jeri Cos VQ Contrast  04/04/2015   CLINICAL DATA:  69 year old male with fever and altered mental status with unresponsiveness. Abnormal lumbar puncture. 4 cm right lung lobe mass. Subsequent encounter.  EXAM: MRI HEAD WITHOUT AND WITH CONTRAST  TECHNIQUE: Multiplanar, multiecho pulse sequences of the brain and surrounding structures were obtained without and with intravenous contrast.  CONTRAST:  65mL MULTIHANCE GADOBENATE DIMEGLUMINE 529 MG/ML IV SOLN  COMPARISON:  04/02/2015 head CT and CT of the chest abdomen and pelvis.  FINDINGS: Exam is motion degraded.  Fast technique imaging had to be utilized  Numerous supratentorial rounded masses with minimal peripheral enhancement/vasogenic edema. These areas demonstrate restricted motion. Additionally, abnormal appearance of cerebral spinal fluid within the ventricles greater on the right with minimal subependymal enhancement and mild subependymal edema. This constellation of findings in addition to patient's fever, elevated white count and abnormal lumbar puncture are suspicious for intracranial abscesses, ventricular and ependymitis. This may represent an atypical infection and correlation with cerebral spinal fluid analysis recommended.  As the patient has a lung mass, if this turns out be a malignancy, cystic metastatic lesions as cause for the above described findings with secondary involvement of the ventricles may be considered but would need to be a diagnosis of exclusion after infection has been considered and excluded.  Minimal  blood breakdown products posterior left temporal lobe and along the periphery of left periatrial cystic lesion.  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  Global atrophy. Currently no hydrocephalus. Patient is at risk for development of hydrocephalus, close follow-up imaging recommended.  Major intracranial vascular structures are patent. Ectatic right internal carotid artery and right carotid terminus. Small left vertebral artery.  Cervical spondylotic changes with spinal stenosis and mild cord flattening C3-4 and C4-5.  Prominent exophthalmos.  Minimal mucosal thickening inferior aspect right maxillary sinus.  IMPRESSION: Exam is motion degraded.  Fast technique imaging had to be utilized  Numerous supratentorial rounded masses with minimal peripheral enhancement/vasogenic edema. These areas demonstrate restricted motion. Additionally, abnormal appearance of cerebral spinal fluid within the ventricles greater on the right with minimal subependymal enhancement and mild to mild subependymal edema. This constellation of findings in addition to patient's fever, elevated white count and abnormal lumbar puncture  are suspicious for intracranial abscesses, ventricular and ependymitis. This may represent an atypical infection (possibly tuberculosis, fungal disease, parasitic disease) and correlation with cerebral spinal fluid analysis recommended.  As the patient has a lung mass, if this turns out be a malignancy, cystic metastatic lesions as cause for the above described findings with secondary involvement of the ventricles may be considered but would need to be a diagnosis of exclusion after infection has been considered and excluded.  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  Global atrophy. Currently no hydrocephalus. Patient is at risk for development of hydrocephalus, close follow-up imaging recommended.  These results were called by telephone at the time of interpretation on 04/04/2015 at 10:44 am to Dr.  Dustin Flock , who verbally acknowledged these results.   Electronically Signed   By: Genia Del M.D.   On: 04/04/2015 11:00   Ct Abdomen Pelvis W Contrast  04/02/2015   ADDENDUM REPORT: 04/02/2015 14:25  ADDENDUM: Patient also has a distended scrotum which appears to be due to large bilateral hydroceles. This could be further assessed, if desired clinically, with scrotal ultrasound. No scrotal air is seen to suggest a gas-forming infection.   Electronically Signed   By: Lajean Manes M.D.   On: 04/02/2015 14:25   04/02/2015   CLINICAL DATA:  Pt via EMS found by brother this am with AMS. Last time known well last Tuesday. Usually A&Ox4 per EMS report. Pt with abnormal chest x-ray from today and scrotal swelling noted on physical exam. Pt unable to answer any med hx questions  EXAM: CT CHEST, ABDOMEN, AND PELVIS WITH CONTRAST  TECHNIQUE: Multidetector CT imaging of the chest, abdomen and pelvis was performed following the standard protocol during bolus administration of intravenous contrast.  CONTRAST:  138mL OMNIPAQUE IOHEXOL 300 MG/ML  SOLN  COMPARISON:  Current chest radiograph  FINDINGS: CT CHEST FINDINGS  Lungs and pleura: 4 cm mass in the right upper lobe correlates with the oval masslike opacity noted on the current chest radiograph. There is also a right upper lobe irregular patchy opacity in coarse reticular opacity. The areas of contiguous regular patchy opacity, which extend from the mass to the superior lateral right upper lobe pleural margin, may reflect additional tumor, conglomerate scarring or a combination. There also cystic areas and bronchiectasis with a prominent bleb at the right apex. Much of the coarse reticular opacities are likely due scarring. There are changes of mild centrilobular emphysema in both lungs mostly in the upper lobes. There is some dependent subsegmental atelectasis in both lower lobes. No left lung mass or suspicious nodule. No pleural effusions or pneumothorax.   Thoracic inlet: Mildly enlarged heterogeneous thyroid gland. No discrete measurable nodule. No neck base masses or adenopathy.  Mediastinum and hila: No masses or pathologically enlarged lymph nodes. Subcarinal node measures 1 cm short axis. There are several sub cm peritracheal nodes. Heart is normal in size. Great vessels are normal in caliber.  CT ABDOMEN AND PELVIS FINDINGS  Liver: 4 mm low-density lesion centrally. 5 mm low-density lesion, suggesting a fatty lesion, is seen in the left lobe. Focal fat projects along the inferior falciform ligament. No other liver abnormalities.  Spleen, gallbladder, pancreas:  Unremarkable.  Adrenal glands. Left adrenal gland is thickened, measuring 17 mm in greatest transverse thickness. Normal right adrenal gland.  Kidneys, ureters, bladder: 5 mm low-density lesion from the lower pole left kidney consistent with a cyst. No other renal masses, no stones and no hydronephrosis. Normal ureters. Bladder is  decompressed around a Foley catheter. Despite being collapse, the wall still appears thickened. No bladder mass.  Lymph nodes:  No enlarged or abnormal appearing lymph nodes.  Ascites: None.  Gastrointestinal: No bowel wall thickening or inflammatory changes. No bowel dilation is seen to suggest obstruction or adynamic ileus. No appendicitis.  MUSCULOSKELETAL  No osteoblastic or osteolytic lesions.  IMPRESSION: 1. 4 cm right upper lobe mass highly suspicious for a primary bronchogenic carcinoma. Irregular mass like opacity seen adjacent to this in the right upper lobe which may reflect conglomerate scarring, additional tumor or combination of both. Other areas of right upper lobe opacity are consistent with chronic scarring. There is an emphysematous bleb at the right apex. 2. No evidence of hilar or mediastinal metastatic disease or of left lung metastatic disease. 3. In the abdomen pelvis, the bladder wall appears thickened. Consider cystitis if there are consistent clinical  symptoms. This could be a chronic finding in this patient. 4. No other evidence of an acute abnormality below the diaphragm. 5. 4 mm low-density lesion in the central liver. This may reflect a small cyst. Metastatic disease is not excluded but felt unlikely. Adjacent lower attenuation liver lesion appears to contain some fat. No other evidence to suggest metastatic disease within the abdomen or pelvis.  Electronically Signed: By: Lajean Manes M.D. On: 04/02/2015 13:44   Dg Chest Portable 1 View  04/02/2015   CLINICAL DATA:  Pt brought in today by EMS for AMS and agitated pt with some shob.  EXAM: PORTABLE CHEST - 1 VIEW  COMPARISON:  None.  FINDINGS: 4.2 cm round opacity projects in the right mid lung. There is more heterogeneous airspace and coarse reticular type opacity above this in the right upper lobe. The right apex there is an emphysematous bleb. Left lung is clear.  Cardiac silhouette is normal in size. Normal mediastinal and left hilar contours. Right hilum is partly obscured by contiguous right upper lobe interstitial type opacities. No obvious hilar mass.  No convincing pleural effusion and no pneumothorax.  Bony thorax is intact.  IMPRESSION: 1. 4.2 cm masslike opacity in the right mid lung. This is concerning for malignancy. 2. Coarse reticular and patchy airspace opacity noted in the right upper lobe. This could reflect infection. 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 3. Recommend follow-up chest CT with contrast.   Electronically Signed   By: Lajean Manes M.D.   On: 04/02/2015 12:04   Dg Lumbar Puncture Fluoro Guide  04/03/2015   CLINICAL DATA:  69 year old male with fever, leukocytosis and altered mental status. Source of the infection the [place] far undetermined. Lumbar puncture attempted in the emergency department by 2 separate physicians. Procedure was unsuccessful secondary to bony hypertrophy of the spinous processes. Emergent lumbar puncture under fluoroscopy is warranted.  EXAM:  DIAGNOSTIC LUMBAR PUNCTURE UNDER FLUOROSCOPIC GUIDANCE  FLUOROSCOPY TIME:  If the device does not provide the exposure index:  Fluoroscopy Time (in minutes and seconds):  30 seconds  Number of Acquired Images:  0  PROCEDURE: Informed consent was obtained from the patient prior to the procedure, including potential complications of headache, allergy, and pain. With the patient prone, the lower back was prepped with Betadine. 1% Lidocaine was used for local anesthesia. Lumbar puncture was performed at the L2-L3 level using a 20 gauge needle with return of cloudy CSF with an opening pressure of 57 cm water. A total of 18 ml of CSF were obtained for laboratory studies. The patient tolerated the procedure well and  there were no apparent complications.  IMPRESSION: 1. Successful L2-L3 lumbar puncture under fluoroscopic guidance. 2. Elevated opening pressure of 57 cm H2O 3. Return of cloudy CSF concerning for bacterial meningitis. A total of 18 mL CSF was obtained secondary to elevation of the pressure. Sample sent for stat Gram stain and culture. Signed,  Criselda Peaches, MD  [facility]  [facility]   Electronically Signed   By: Jacqulynn Cadet M.D.   On: 04/03/2015 08:13    EKG: Normal sinus rhythm with right bundle branch block  Weights: Filed Weights   04/02/15 1052 04/04/15 0500  Weight: 190 lb (86.183 kg) 170 lb 13.7 oz (77.5 kg)     Physical Exam: Blood pressure 149/85, pulse 77, temperature 99.7 F (37.6 C), temperature source Axillary, resp. rate 12, height 5\' 11"  (1.803 m), weight 170 lb 13.7 oz (77.5 kg), SpO2 100 %. Body mass index is 23.84 kg/(m^2). General: Well developed, well nourished, in no acute distress. Head eyes ears nose throat: Normocephalic, atraumatic, sclera non-icteric, no xanthomas, nares are without discharge. No apparent thyromegaly and/or mass  Lungs: Normal respiratory effort.  no wheezes, no rales, no rhonchi.  Heart:  RRR with normal S1 S2. no murmur gallop, no rub, PMI is normal size and placement, carotid upstroke normal without bruit, jugular venous pressure is normal Abdomen: Soft, non-tender, non-distended with normoactive bowel sounds. No hepatomegaly. No rebound/guarding. No obvious abdominal masses. Abdominal aorta is normal size without bruit Extremities trace edema. no cyanosis, no clubbing, no ulcers  Peripheral : 2+ bilateral upper extremity pulses, 2+ bilateral femoral pulses, 2+ bilateral dorsal pedal pulse Neuro: Obliquely obtunded      Assessment: 70 year old male with essential hypertension with acute meningitis and diffuse infection with elevated troponin most consistent with demand ischemia and illness rather than acute coronary syndrome  Plan: 1. No further cardiac workup at this time 2. Continue follow with supportive care and no additional treatment for tachycardia most consistent with illness 3. Echocardiogram for LV systolic dysfunction valvular heart disease which may contribute to above 4. Further treatment options after above  Signed, Corey Skains M.D. [facility] 04/04/2015, 6:17 PM

## 2015-04-04 NOTE — Clinical Documentation Improvement (Signed)
  12/01/14:  K= 3.6 12/02/14:  K= 3.1 12/02/14:  Order for KCL 10 mEq in 100 ml IVPB.  Please clarify the specific medical condition associated with the above findings.  Hypokalemia Other Unable to suspect or determine  Thank you!  Pryor Montes, BSN, RN [facility]/Clinical Documentation Specialist [email or web address] [phone]

## 2015-04-04 NOTE — Care Management (Signed)
Per MD notes possible meningitis. WBC >30. RNCM to continue to follow.

## 2015-04-04 NOTE — Progress Notes (Signed)
Pt accepted at [facility] bed assignment [phone]; report given to both [facility] RN at [place] transporter coordinator; informed RN and coordinator pt extremely lethargic withdraws from painful stimulation and will open his eyes occasionally to voice GCS score 10 Dr. Posey Pronto and Dr. Irish Elders aware of neurological function; vss with pt off of Cardene drip since this am; pt is able to protect airway with minimal secretions a gag and cough reflex is present; pt currently on RA with O2 sats 95 to 100%; informed coordinator and RN pt is currently on droplet precautions due to possible bacterial meningitis dx; provided all lab values; mri results; echo results; and csf results; informed foley in place with adequate uop; NS infusing at 100 ml/hr via peripheral iv; informed RN and coordinator pt has 2 brothers who have been updated about plan of care will continue to monitor and assess pt; transport coordinator to arrange pt transportation to [facility]

## 2015-04-04 NOTE — Discharge Summary (Signed)
Nathan Thomas, 69 y.o., DOB Nov 24, 1945, MRN [id number]. 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 male with no significant past medical hAt the time of my examinatiistory who presented to the emergency room unresponsive. Per the EMS report he was found by his brother unresponsive at his home. Last known well  5 days ago. AWhen brought in by EMS he was disoriented and uncooperative. Patient was noted to have a WBC count of 26,000 and a fever of 102 on admission. Underwent a CT scan of the head which was negative. Patient also had a lumbar puncture Which showed severely low CSF glucose, high opening pressure as well as segmented neutrophils of 5693. Patient was admitted and started on IV anabiotic's for possible meningitis. He was treated with vancomycin and acyclovir and Fortaz. Patient continued to have decrease in responsiveness therefore underwent a MRI of the brain today and his MRIs showing that he's got ventriculitis and ependymitris. His CSF cultures are negative including fungal cultures. Patient also has a CT scan of the chest which showed a lung mass according to radiologist the differential for the findings in the brain likely is infectious in nature but malignancy cannot be ruled out. Due to complex nature of patient's current presentation and no infectious disease coverage he is being transferred to [facility]. He has been accepted by the physician in the ICU there            Consults   neurology  Significant Tests:  See full reports for all details    Ct Head Wo Contrast  04/02/2015   CLINICAL DATA:  Pt via EMS found by brother this am with AMS. Last time known well last Tuesday. Usually A&Ox4 per EMS report. Pt uncooperative at this time. Agitated.  EXAM: CT HEAD WITHOUT CONTRAST  TECHNIQUE: Contiguous axial images were obtained from the base of the skull through the vertex without intravenous contrast.  COMPARISON:  None  FINDINGS: Ventricles are normal in configuration. There is mild, age related, ventricular and sulcal enlargement. No hydrocephalus.  There are no parenchymal masses or mass effect. There is no evidence of a recent infarct. Small area of hypoattenuation the left cerebellum consistent with an old infarct.  There are no extra-axial masses or abnormal fluid collections.  There is no intracranial hemorrhage.  Visualized sinuses and mastoid air cells are clear. No skull fracture or lesion.  IMPRESSION: 1. No acute intracranial abnormalities. 2. Age related volume loss.  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   Electronically Signed   By: Lajean Manes M.D.   On: 04/02/2015 11:55   Ct Chest W Contrast  04/02/2015   ADDENDUM REPORT: 04/02/2015 14:25  ADDENDUM: Patient also has a distended scrotum which appears to be due to large bilateral hydroceles. This could be further assessed, if desired clinically, with scrotal ultrasound. No scrotal air is seen to suggest a gas-forming infection.   Electronically Signed   By: Lajean Manes M.D.   On: 04/02/2015 14:25   04/02/2015   CLINICAL DATA:  Pt via EMS found by brother this am with  AMS. Last time known well last Tuesday. Usually A&Ox4 per EMS report. Pt with abnormal chest x-ray from today and scrotal swelling noted on physical exam. Pt unable to answer any med hx questions  EXAM: CT CHEST, ABDOMEN, AND PELVIS WITH CONTRAST  TECHNIQUE: Multidetector CT imaging of the chest, abdomen and pelvis was performed following the standard  protocol during bolus administration of intravenous contrast.  CONTRAST:  151mL OMNIPAQUE IOHEXOL 300 MG/ML  SOLN  COMPARISON:  Current chest radiograph  FINDINGS: CT CHEST FINDINGS  Lungs and pleura: 4 cm mass in the right upper lobe correlates with the oval masslike opacity noted on the current chest radiograph. There is also a right upper lobe irregular patchy opacity in coarse reticular opacity. The areas of contiguous regular patchy opacity, which extend from the mass to the superior lateral right upper lobe pleural margin, may reflect additional tumor, conglomerate scarring or a combination. There also cystic areas and bronchiectasis with a prominent bleb at the right apex. Much of the coarse reticular opacities are likely due scarring. There are changes of mild centrilobular emphysema in both lungs mostly in the upper lobes. There is some dependent subsegmental atelectasis in both lower lobes. No left lung mass or suspicious nodule. No pleural effusions or pneumothorax.  Thoracic inlet: Mildly enlarged heterogeneous thyroid gland. No discrete measurable nodule. No neck base masses or adenopathy.  Mediastinum and hila: No masses or pathologically enlarged lymph nodes. Subcarinal node measures 1 cm short axis. There are several sub cm peritracheal nodes. Heart is normal in size. Great vessels are normal in caliber.  CT ABDOMEN AND PELVIS FINDINGS  Liver: 4 mm low-density lesion centrally. 5 mm low-density lesion, suggesting a fatty lesion, is seen in the left lobe. Focal fat projects along the inferior falciform ligament. No other liver abnormalities.  Spleen, gallbladder, pancreas:  Unremarkable.  Adrenal glands. Left adrenal gland is thickened, measuring 17 mm in greatest transverse thickness. Normal right adrenal gland.  Kidneys, ureters, bladder: 5 mm low-density lesion from the lower pole left kidney consistent with a cyst. No other renal masses, no stones and no hydronephrosis. Normal ureters. Bladder is  decompressed around a Foley catheter. Despite being collapse, the wall still appears thickened. No bladder mass.  Lymph nodes:  No enlarged or abnormal appearing lymph nodes.  Ascites: None.  Gastrointestinal: No bowel wall thickening or inflammatory changes. No bowel dilation is seen to suggest obstruction or adynamic ileus. No appendicitis.  MUSCULOSKELETAL  No osteoblastic or osteolytic lesions.  IMPRESSION: 1. 4 cm right upper lobe mass highly suspicious for a primary bronchogenic carcinoma. Irregular mass like opacity seen adjacent to this in the right upper lobe which may reflect conglomerate scarring, additional tumor or combination of both. Other areas of right upper lobe opacity are consistent with chronic scarring. There is an emphysematous bleb at the right apex. 2. No evidence of hilar or mediastinal metastatic disease or of left lung metastatic disease. 3. In the abdomen pelvis, the bladder wall appears thickened. Consider cystitis if there are consistent clinical symptoms. This could be a chronic finding in this patient. 4. No other evidence of an acute abnormality below the diaphragm. 5. 4 mm low-density lesion in the central liver. This may reflect a small cyst. Metastatic disease is not excluded but felt unlikely. Adjacent lower attenuation liver lesion appears to contain some fat. No other evidence to suggest metastatic disease within the abdomen or pelvis.  Electronically Signed: By: Lajean Manes M.D. On: 04/02/2015 13:44   Mr  Brain W Wo Contrast  04/04/2015   CLINICAL DATA:  69 year old male with fever and altered mental status with unresponsiveness. Abnormal lumbar puncture. 4 cm right lung lobe mass. Subsequent encounter.  EXAM: MRI HEAD WITHOUT AND WITH CONTRAST  TECHNIQUE: Multiplanar, multiecho pulse sequences of the brain and surrounding structures were obtained without and with intravenous contrast.  CONTRAST:  13mL MULTIHANCE GADOBENATE DIMEGLUMINE 529 MG/ML IV SOLN  COMPARISON:   04/02/2015 head CT and CT of the chest abdomen and pelvis.  FINDINGS: Exam is motion degraded.  Fast technique imaging had to be utilized  Numerous supratentorial rounded masses with minimal peripheral enhancement/vasogenic edema. These areas demonstrate restricted motion. Additionally, abnormal appearance of cerebral spinal fluid within the ventricles greater on the right with minimal subependymal enhancement and mild subependymal edema. This constellation of findings in addition to patient's fever, elevated white count and abnormal lumbar puncture are suspicious for intracranial abscesses, ventricular and ependymitis. This may represent an atypical infection and correlation with cerebral spinal fluid analysis recommended.  As the patient has a lung mass, if this turns out be a malignancy, cystic metastatic lesions as cause for the above described findings with secondary involvement of the ventricles may be considered but would need to be a diagnosis of exclusion after infection has been considered and excluded.  Minimal blood breakdown products posterior left temporal lobe and along the periphery of left periatrial cystic lesion.  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  Global atrophy. Currently no hydrocephalus. Patient is at risk for development of hydrocephalus, close follow-up imaging recommended.  Major intracranial vascular structures are patent. Ectatic right internal carotid artery and right carotid terminus. Small left vertebral artery.  Cervical spondylotic changes with spinal stenosis and mild cord flattening C3-4 and C4-5.  Prominent exophthalmos.  Minimal mucosal thickening inferior aspect right maxillary sinus.  IMPRESSION: Exam is motion degraded.  Fast technique imaging had to be utilized  Numerous supratentorial rounded masses with minimal peripheral enhancement/vasogenic edema. These areas demonstrate restricted motion. Additionally, abnormal appearance of cerebral spinal fluid within the  ventricles greater on the right with minimal subependymal enhancement and mild to mild subependymal edema. This constellation of findings in addition to patient's fever, elevated white count and abnormal lumbar puncture are suspicious for intracranial abscesses, ventricular and ependymitis. This may represent an atypical infection (possibly tuberculosis, fungal disease, parasitic disease) and correlation with cerebral spinal fluid analysis recommended.  As the patient has a lung mass, if this turns out be a malignancy, cystic metastatic lesions as cause for the above described findings with secondary involvement of the ventricles may be considered but would need to be a diagnosis of exclusion after infection has been considered and excluded.  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  Global atrophy. Currently no hydrocephalus. Patient is at risk for development of hydrocephalus, close follow-up imaging recommended.  These results were called by telephone at the time of interpretation on 04/04/2015 at 10:44 am to Dr. Dustin Flock , who verbally acknowledged these results.   Electronically Signed   By: Genia Del M.D.   On: 04/04/2015 11:00   Ct Abdomen Pelvis W Contrast  04/02/2015   ADDENDUM REPORT: 04/02/2015 14:25  ADDENDUM: Patient also has a distended scrotum which appears to be due to large bilateral hydroceles. This could be further assessed, if desired clinically, with scrotal ultrasound. No scrotal air is seen to suggest a gas-forming infection.   Electronically Signed   By: Lajean Manes M.D.   On: 04/02/2015 14:25  04/02/2015   CLINICAL DATA:  Pt via EMS found by brother this am with AMS. Last time known well last Tuesday. Usually A&Ox4 per EMS report. Pt with abnormal chest x-ray from today and scrotal swelling noted on physical exam. Pt unable to answer any med hx questions  EXAM: CT CHEST, ABDOMEN, AND PELVIS WITH CONTRAST  TECHNIQUE: Multidetector CT imaging of the chest, abdomen and  pelvis was performed following the standard protocol during bolus administration of intravenous contrast.  CONTRAST:  159mL OMNIPAQUE IOHEXOL 300 MG/ML  SOLN  COMPARISON:  Current chest radiograph  FINDINGS: CT CHEST FINDINGS  Lungs and pleura: 4 cm mass in the right upper lobe correlates with the oval masslike opacity noted on the current chest radiograph. There is also a right upper lobe irregular patchy opacity in coarse reticular opacity. The areas of contiguous regular patchy opacity, which extend from the mass to the superior lateral right upper lobe pleural margin, may reflect additional tumor, conglomerate scarring or a combination. There also cystic areas and bronchiectasis with a prominent bleb at the right apex. Much of the coarse reticular opacities are likely due scarring. There are changes of mild centrilobular emphysema in both lungs mostly in the upper lobes. There is some dependent subsegmental atelectasis in both lower lobes. No left lung mass or suspicious nodule. No pleural effusions or pneumothorax.  Thoracic inlet: Mildly enlarged heterogeneous thyroid gland. No discrete measurable nodule. No neck base masses or adenopathy.  Mediastinum and hila: No masses or pathologically enlarged lymph nodes. Subcarinal node measures 1 cm short axis. There are several sub cm peritracheal nodes. Heart is normal in size. Great vessels are normal in caliber.  CT ABDOMEN AND PELVIS FINDINGS  Liver: 4 mm low-density lesion centrally. 5 mm low-density lesion, suggesting a fatty lesion, is seen in the left lobe. Focal fat projects along the inferior falciform ligament. No other liver abnormalities.  Spleen, gallbladder, pancreas:  Unremarkable.  Adrenal glands. Left adrenal gland is thickened, measuring 17 mm in greatest transverse thickness. Normal right adrenal gland.  Kidneys, ureters, bladder: 5 mm low-density lesion from the lower pole left kidney consistent with a cyst. No other renal masses, no stones and no  hydronephrosis. Normal ureters. Bladder is decompressed around a Foley catheter. Despite being collapse, the wall still appears thickened. No bladder mass.  Lymph nodes:  No enlarged or abnormal appearing lymph nodes.  Ascites: None.  Gastrointestinal: No bowel wall thickening or inflammatory changes. No bowel dilation is seen to suggest obstruction or adynamic ileus. No appendicitis.  MUSCULOSKELETAL  No osteoblastic or osteolytic lesions.  IMPRESSION: 1. 4 cm right upper lobe mass highly suspicious for a primary bronchogenic carcinoma. Irregular mass like opacity seen adjacent to this in the right upper lobe which may reflect conglomerate scarring, additional tumor or combination of both. Other areas of right upper lobe opacity are consistent with chronic scarring. There is an emphysematous bleb at the right apex. 2. No evidence of hilar or mediastinal metastatic disease or of left lung metastatic disease. 3. In the abdomen pelvis, the bladder wall appears thickened. Consider cystitis if there are consistent clinical symptoms. This could be a chronic finding in this patient. 4. No other evidence of an acute abnormality below the diaphragm. 5. 4 mm low-density lesion in the central liver. This may reflect a small cyst. Metastatic disease is not excluded but felt unlikely. Adjacent lower attenuation liver lesion appears to contain some fat. No other evidence to suggest metastatic disease within the abdomen or  pelvis.  Electronically Signed: By: Lajean Manes M.D. On: 04/02/2015 13:44   Dg Chest Portable 1 View  04/02/2015   CLINICAL DATA:  Pt brought in today by EMS for AMS and agitated pt with some shob.  EXAM: PORTABLE CHEST - 1 VIEW  COMPARISON:  None.  FINDINGS: 4.2 cm round opacity projects in the right mid lung. There is more heterogeneous airspace and coarse reticular type opacity above this in the right upper lobe. The right apex there is an emphysematous bleb. Left lung is clear.  Cardiac silhouette is  normal in size. Normal mediastinal and left hilar contours. Right hilum is partly obscured by contiguous right upper lobe interstitial type opacities. No obvious hilar mass.  No convincing pleural effusion and no pneumothorax.  Bony thorax is intact.  IMPRESSION: 1. 4.2 cm masslike opacity in the right mid lung. This is concerning for malignancy. 2. Coarse reticular and patchy airspace opacity noted in the right upper lobe. This could reflect infection. 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 3. Recommend follow-up chest CT with contrast.   Electronically Signed   By: Lajean Manes M.D.   On: 04/02/2015 12:04   Dg Lumbar Puncture Fluoro Guide  04/03/2015   CLINICAL DATA:  69 year old male with fever, leukocytosis and altered mental status. Source of the infection the [place] far undetermined. Lumbar puncture attempted in the emergency department by 2 separate physicians. Procedure was unsuccessful secondary to bony hypertrophy of the spinous processes. Emergent lumbar puncture under fluoroscopy is warranted.  EXAM: DIAGNOSTIC LUMBAR PUNCTURE UNDER FLUOROSCOPIC GUIDANCE  FLUOROSCOPY TIME:  If the device does not provide the exposure index:  Fluoroscopy Time (in minutes and seconds):  30 seconds  Number of Acquired Images:  0  PROCEDURE: Informed consent was obtained from the patient prior to the procedure, including potential complications of headache, allergy, and pain. With the patient prone, the lower back was prepped with Betadine. 1% Lidocaine was used for local anesthesia. Lumbar puncture was performed at the L2-L3 level using a 20 gauge needle with return of cloudy CSF with an opening pressure of 57 cm water. A total of 18 ml of CSF were obtained for laboratory studies. The patient tolerated the procedure well and there were no apparent complications.  IMPRESSION: 1. Successful L2-L3 lumbar puncture under fluoroscopic guidance. 2. Elevated opening pressure of 57 cm H2O 3. Return of cloudy CSF concerning for  bacterial meningitis. A total of 18 mL CSF was obtained secondary to elevation of the pressure. Sample sent for stat Gram stain and culture. Signed,  Criselda Peaches, MD  [facility]  [facility]   Electronically Signed   By: Jacqulynn Cadet M.D.   On: 04/03/2015 08:13       Today   Subjective:   Nathan Thomas  (his eyes but poorly responsive  Objective:   Blood pressure 138/83, pulse 90, temperature 97.9 F (36.6 C), temperature source Oral, resp. rate 27, height 5\' 11"  (1.803 m), weight 77.5 kg (170 lb 13.7 oz), SpO2 100 %.  .  Intake/Output Summary (Last 24 hours) at 04/04/15 1148 Last data filed at 04/04/15 0618  Gross per 24 hour  Intake   4027 ml  Output   1220 ml  Net   2807 ml    Exam VITAL SIGNS: Blood pressure 138/83, pulse 90, temperature 97.9 F (36.6 C), temperature source Oral, resp. rate 27, height 5\' 11"  (1.803 m), weight 77.5 kg (170 lb 13.7 oz), SpO2 100 %.  GENERAL:  69 y.o.-year-old patient lying in the bednot responsive  EYES: Pupils equal, round, reactive to light and accommodation. No scleral icterus. Extraocular muscles intact.  HEENT: Head atraumatic, normocephalic. Oropharynx and nasopharynx clear.  NECK:  Supple, no jugular venous distention. No thyroid enlargement, no tenderness.  LUNGS: Normal breath sounds bilaterally, no wheezing, rales,rhonchi or crepitation. No use of accessory muscles of respiration.  CARDIOVASCULAR: S1, S2 normal. No murmurs, rubs, or gallops.  ABDOMEN: Soft, nontender, nondistended. Bowel sounds present. No organomegaly or mass.  EXTREMITIES: No pedal edema, cyanosis, or clubbing.  NEUROLOGIC:Not responding to any commands  PSYCHIATRIC:Poorly response  SKIN: No obvious rash, lesion, or ulcer.   Data Review     CBC w Diff: Lab Results  Component Value Date   WBC 30.4* 04/04/2015   HGB 13.9 04/04/2015   HCT 45.5 04/04/2015   PLT 232 04/04/2015   LYMPHOPCT 3 04/02/2015    MONOPCT 6 04/02/2015   EOSPCT 0 04/02/2015   BASOPCT 0 04/02/2015   CMP: Lab Results  Component Value Date   NA 137 04/04/2015   K 4.0 04/04/2015   CL 111 04/04/2015   CO2 20* 04/04/2015   BUN 31* 04/04/2015   CREATININE 1.17 04/04/2015   PROT 6.1* 04/04/2015   ALBUMIN 2.1* 04/04/2015   BILITOT 1.2 04/04/2015   ALKPHOS 69 04/04/2015   AST 147* 04/04/2015   ALT 73* 04/04/2015  .  Micro Results Recent Results (from the past 240 hour(s))  Blood culture (routine x 2)     Status: None (Preliminary result)   Collection Time: 04/02/15 11:06 AM  Result Value Ref Range Status   Specimen Description BLOOD RIGHT ASSIST CONTROL  Final   Special Requests BOTTLES DRAWN AEROBIC AND ANAEROBIC 2CC  Final   Culture NO GROWTH 2 DAYS  Final   Report Status PENDING  Incomplete  Blood culture (routine x 2)     Status: None (Preliminary result)   Collection Time: 04/02/15 11:13 AM  Result Value Ref Range Status   Specimen Description BLOOD RIGHT ASSIST CONTROL  Final   Special Requests   Final    BOTTLES DRAWN AEROBIC AND ANAEROBIC ANA 3CC AER 2 CC   Culture NO GROWTH 2 DAYS  Final   Report Status PENDING  Incomplete  CSF culture     Status: None (Preliminary result)   Collection Time: 04/02/15  5:00 PM  Result Value Ref Range Status   Specimen Description CSF  Final   Special Requests Normal  Final   Gram Stain   Final    MANY POLYMORPHONUCLEAR WBC SEEN NO ORGANISMS SEEN    Culture NO GROWTH 2 DAYS  Final   Report Status PENDING  Incomplete  Culture, fungus without smear-CSF     Status: None (Preliminary result)   Collection Time: 04/02/15  5:00 PM  Result Value Ref Range Status   Specimen Description CSF  Final   Special Requests Normal  Final   Culture NO GROWTH < 24 HOURS  Final   Report Status PENDING  Incomplete  Anaerobic culture     Status: None (Preliminary result)   Collection Time: 04/02/15  5:00 PM  Result Value Ref Range Status   Specimen Description CSF  Final    Special Requests Normal  Final   Culture   Final    NO ANAEROBES ISOLATED; CULTURE IN PROGRESS FOR 5 DAYS   Report Status PENDING  Incomplete  MRSA PCR Screening     Status: None   Collection Time:  04/02/15  9:00 PM  Result Value Ref Range Status   MRSA by PCR NEGATIVE NEGATIVE Final    Comment:        The GeneXpert MRSA Assay (FDA approved for NASAL specimens only), is one component of a comprehensive MRSA colonization surveillance program. It is not intended to diagnose MRSA infection nor to guide or monitor treatment for MRSA infections.         Code Status Orders        Start     Ordered   04/02/15 1722  Full code   Continuous     04/02/15 1721            Discharge Medications     Medication List    Notice    You have not been prescribed any medications.         Total Time in preparing paper work, data evaluation and todays exam -additional 70 minutes of critical care time spent arranging patient's transfer to Dianah Field M.D on 04/04/2015 at 11:48 AM  [facility]   Office  [phone]

## 2015-04-04 NOTE — Progress Notes (Signed)
Pt transferred to [facility] via [facility] ground transport report given to [place]; pts chart with disc given to transport team; will notify family

## 2015-04-05 LAB — HEPATITIS PANEL, ACUTE
Hep A IgM: NEGATIVE
Hep B C IgM: NEGATIVE
Hepatitis B Surface Ag: NEGATIVE

## 2015-04-05 LAB — CSF CULTURE: SPECIAL REQUESTS: NORMAL

## 2015-04-05 LAB — CSF CULTURE W GRAM STAIN: Culture: NO GROWTH

## 2015-04-06 LAB — ROCKY MTN SPOTTED FVR ABS PNL(IGG+IGM)
RMSF IgG: UNDETERMINED
RMSF IgM: 0.23 index (ref 0.00–0.89)

## 2015-04-06 LAB — RMSF, IGG, IFA: RMSF, IGG, IFA: <1:64 {titer}

## 2015-04-06 LAB — HEPATITIS PANEL, ACUTE
Hep A IgM: NEGATIVE
Hep B C IgM: NEGATIVE
Hepatitis B Surface Ag: NEGATIVE

## 2015-04-08 LAB — CULTURE, BLOOD (ROUTINE X 2)
CULTURE: NO GROWTH
Culture: NO GROWTH

## 2015-04-08 LAB — ANAEROBIC CULTURE: SPECIAL REQUESTS: NORMAL

## 2015-04-08 LAB — PATHOLOGIST SMEAR REVIEW

## 2015-04-12 DIAGNOSIS — I214 Non-ST elevation (NSTEMI) myocardial infarction: Secondary | ICD-10-CM | POA: Insufficient documentation

## 2015-04-23 LAB — CULTURE, FUNGUS WITHOUT SMEAR
CULTURE: NO GROWTH
SPECIAL REQUESTS: NORMAL

## 2015-05-27 DIAGNOSIS — G060 Intracranial abscess and granuloma: Secondary | ICD-10-CM | POA: Insufficient documentation

## 2015-07-22 ENCOUNTER — Encounter: Payer: Self-pay | Admitting: Emergency Medicine

## 2015-07-22 ENCOUNTER — Emergency Department
Admission: EM | Admit: 2015-07-22 | Discharge: 2015-07-22 | Disposition: A | Discharge status: Home / Self Care | Payer: Medicare Other | Attending: Emergency Medicine | Admitting: Emergency Medicine

## 2015-07-22 DIAGNOSIS — Z0000 Encounter for general adult medical examination without abnormal findings: Secondary | ICD-10-CM | POA: Diagnosis not present

## 2015-07-22 DIAGNOSIS — Z431 Encounter for attention to gastrostomy: Secondary | ICD-10-CM | POA: Diagnosis present

## 2015-07-22 DIAGNOSIS — I10 Essential (primary) hypertension: Secondary | ICD-10-CM | POA: Insufficient documentation

## 2015-07-22 DIAGNOSIS — Z139 Encounter for screening, unspecified: Secondary | ICD-10-CM

## 2015-07-22 DIAGNOSIS — Z931 Gastrostomy status: Secondary | ICD-10-CM

## 2015-07-22 HISTORY — DX: Essential (primary) hypertension: I10

## 2015-07-22 HISTORY — DX: Hyperlipidemia, unspecified: E78.5

## 2015-07-22 HISTORY — DX: Gastro-esophageal reflux disease without esophagitis: K21.9

## 2015-07-22 HISTORY — DX: Major depressive disorder, single episode, unspecified: F32.9

## 2015-07-22 NOTE — ED Notes (Signed)
Pt has an order from [facility] md at [facility] for g tube removal in the ED.  i called [facility] and they said they tried [facility] and they told them to sent pt to ed.  i called [facility] and they said they cannot remove a g tube there.  Pt checked in to ed.

## 2015-07-22 NOTE — ED Notes (Addendum)
Pt from  health care here to have g tube removed. Unable to do so at facility and or [facility]. Has not had to use in over a month.

## 2015-07-22 NOTE — ED Provider Notes (Signed)
[facility] Emergency Department Provider Note REMINDER - THIS NOTE IS NOT A FINAL MEDICAL RECORD UNTIL IT IS SIGNED. UNTIL THEN, THE CONTENT BELOW MAY REFLECT INFORMATION FROM A DOCUMENTATION TEMPLATE, NOT THE ACTUAL PATIENT VISIT. ____________________________________________  Time seen: Approximately 4:16 PM  I have reviewed the triage vital signs and the nursing notes.   HISTORY  Chief Complaint GI Problem    HPI Nathan Thomas is a 69 y.o. male is a very calm care medical history including prolonged ICU course and admission at [facility] where he had a PEG tube placed. He reports that he was evaluated today and rehabilitation facility physician ordered that his PEG tube could be removed, he then went to the clinic as they're unable to remain at the rehabilitation, and was advised that they cannot remove it at the clinic either. He denies being in any pain, any symptoms. He reports that he is eating by mouth well, and that the G-tube is not currently being used for anything. He is not vomiting or having any abdominal discomfort or pain. States that he is feeling fine just needs to have this tube removed is  no longer used.   Past Medical History  Diagnosis Date  . Hypertension   . Hyperlipidemia   . GERD (gastroesophageal reflux disease)   . Major depressive disorder [facility])     Patient Active Problem List   Diagnosis Date Noted  . Fever 04/02/2015    Past Surgical History  Procedure Laterality Date  . Gastrectomy      No current outpatient prescriptions on file.  Allergies Review of patient's allergies indicates no known allergies.  No family history on file.  Social History Social History  Substance Use Topics  . Smoking status: Never Smoker   . Smokeless tobacco: None  . Alcohol Use: No    Review of Systems Constitutional: No fever/chills Eyes: No visual changes. ENT: No sore throat. Cardiovascular: Denies chest pain. Respiratory: Denies  shortness of breath. Gastrointestinal: No abdominal pain.  No nausea, no vomiting.  No diarrhea.  No constipation. Genitourinary: Negative for dysuria. Musculoskeletal: Negative for back pain. Skin: Negative for rash. Neurological: Negative for headaches, focal weakness or numbness.  10-point ROS otherwise negative.  ____________________________________________   PHYSICAL EXAM:  VITAL SIGNS: ED Triage Vitals  Enc Vitals Group     BP 07/22/15 1330 119/75 mmHg     Pulse Rate 07/22/15 1330 60     Resp 07/22/15 1330 18     Temp 07/22/15 1330 97.8 F (36.6 C)     Temp Source 07/22/15 1330 Oral     SpO2 07/22/15 1330 97 %     Weight 07/22/15 1330 150 lb (68.04 kg)     Height 07/22/15 1330 6\' 1"  (1.854 m)     Head Cir --      Peak Flow --      Pain Score 07/22/15 1607 0     Pain Loc --      Pain Edu? --      Excl. in [place]? --    Constitutional: Alert and oriented. Well appearing and in no acute distress. Eyes: Conjunctivae are normal. PERRL. EOMI. Head: Atraumatic. Nose: No congestion/rhinnorhea. Mouth/Throat: Mucous membranes are moist.  Oropharynx non-erythematous. Neck: No stridor.   Cardiovascular: Normal rate, regular rhythm. Grossly normal heart sounds.  Good peripheral circulation. Respiratory: Normal respiratory effort.  No retractions. Lungs CTAB. Gastrointestinal: Soft and nontender. No distention. No abdominal bruits. No CVA tenderness. There is a clean dry  and intact PEG tube present. No evidence of surrounding erythema, swelling, or evidence of complication. Musculoskeletal: No lower extremity tenderness nor edema.  No joint effusions. Neurologic:  Normal speech and language. No gross focal neurologic deficits are appreciated. No gait instability. Skin:  Skin is warm, dry and intact. No rash noted. Psychiatric: Mood and affect are normal. Speech and behavior are normal.  ____________________________________________   LABS (all labs ordered are listed, but only  abnormal results are displayed)  Labs Reviewed - No data to display ____________________________________________  EKG   ____________________________________________  RADIOLOGY   ____________________________________________   PROCEDURES  Procedure(s) performed: None  Critical Care performed: No  ____________________________________________   INITIAL IMPRESSION / ASSESSMENT AND PLAN / ED COURSE  Pertinent labs & imaging results that were available during my care of the patient were reviewed by me and considered in my medical decision making (see chart for details).  Patient presents for removal of a PEG tube. He does not appear to have any acute complication, and we after discussion with gastroenterology feel that the most appropriate place for evaluation and potential removal would be with gastroenterology.  Discussed case and care with Dr. Allen Norris of GI. Advises that this feeding tube may require specific procedure and specialist evaluation for removal. Advises that patient needs follow-up with gastroenterology in clinic and I will NOT to attempt removal in the ER. I am in full agreement as do not wish to cause any harm and patietn has no signs of complications from this feeding tube at this time.  Discussed with patient, he will have to setup outpatient follow-up with Dr. Allen Norris of gastroenterology for assessment of removal.   Patient is quite stable, no gastrointestinal symptoms. Awake alert in no distress. I will discharge him back to his rehabilitation facility to set up close follow-up with Dr. Allen Norris or gastrointestinal service at [facility] who placed the tube. ____________________________________________   FINAL CLINICAL IMPRESSION(S) / ED DIAGNOSES  Final diagnoses:  Status post insertion of percutaneous endoscopic gastrostomy (PEG) tube [facility])  Encounter for medical screening examination        Delman Kitten, MD 07/22/15 1731

## 2015-07-22 NOTE — Discharge Instructions (Signed)
We discussed your feeding tube with gastroenterology, they advised follow-up in the clinic for evaluation for removal. He may follow up with gastroenterology, Dr. Allen Norris or with gastroenterology at [facility]. Please call to set up an appointment.  Return to the emergency room if you develop vomiting, fever, notice redness swelling or drainage around your feeding tube site, have bleeding, dark stools or other new concerns arise.  Care of a Feeding Tube Feeding tubes are often given to those who have trouble swallowing or cannot take food or medicine. A feeding tube can:   Go into the nose and down to the stomach.  Go through the skin in the belly (abdomen) and into the stomach or small bowel. SUPPLIES NEEDED TO CARE FOR THE TUBE SITE  Clean gloves.  Clean wash cloth, gauze pads, or soft paper towel.  Cotton swabs.  Skin barrier ointment or cream.  Soap and water.  Precut foam pads or gauze (that go around the tube).  Tube tape. TUBE SITE CARE 1. Have all supplies ready. 2. Wash hands well. 3.  Put on clean gloves. 4. Remove dirty foam pads or gauze near the tube site, if present. 5. Check the skin around the tube site for redness, rash, puffiness (swelling), leaking fluid, or extra tissue growth. Call your doctor if you see any of these. 6. Wet the gauze and cotton swabs with water and soap. 7. Wipe the area closest to the tube with cotton swabs. Wipe the surrounding skin with moistened gauze. Rinse with water. 8. Dry the skin and tube site with a dry gauze pad or soft paper towel. Do not use antibiotic ointments at the tube site. 9. If the skin is red, apply petroleum jelly in a circular motion, using a cotton swab. Your doctor may suggest a different cream or ointment. Use what the doctor suggests. 10. Apply a new pre-cut foam pad or gauze around the tube. Tape the edges down. Foam pads or gauze may be left off if there is no fluid at the tube site. 11. Use tape or a device that will  attach your feeding tube to your skin or do as directed. Rotate where you tape the tube. 12. Sit the person up. 13. Throw away used supplies. 14. Remove gloves. 15. Wash hands. SUPPLIES NEEDED TO FLUSH A FEEDING TUBE  Clean gloves.  60 mL syringe (that connects to feeding tube).  Towel.  Water. FLUSHING A FEEDING TUBE  1. Have all supplies ready. 2. Wash hands well. 3. Put on clean gloves. 4. Pull 30 mL of water into the syringe. 5. Bend (kink) the feeding tube while disconnecting it from the feeding-bag tubing or while removing the plug at the end of the tube. 6. Insert the tip of the syringe into the end of the feeding tube. Stop bending the tube. Slowly inject the water. 7. If you cannot inject the water, the person with the feeding tube should lay on their left side. 8. After injecting the water, remove the syringe. 9. Always flush the tube before giving the first medicine, between medicines, and after the final medicine before starting a feeding. 10. Throw away used supplies. 11. Remove gloves. 12. Wash hands.   This information is not intended to replace advice given to you by your health care provider. Make sure you discuss any questions you have with your health care provider.   Document Released: 05/28/2012 Document Reviewed: 05/28/2012 Elsevier Interactive Patient Education [employer].

## 2015-11-10 ENCOUNTER — Inpatient Hospital Stay: Payer: Medicare Other | Admitting: Oncology

## 2015-11-24 ENCOUNTER — Inpatient Hospital Stay: Payer: Medicare Other | Admitting: Oncology

## 2016-01-24 ENCOUNTER — Inpatient Hospital Stay: Payer: Medicare Other | Admitting: Oncology

## 2016-03-04 ENCOUNTER — Emergency Department: Payer: Medicare Other

## 2016-03-04 ENCOUNTER — Emergency Department
Admission: EM | Admit: 2016-03-04 | Discharge: 2016-03-04 | Disposition: A | Discharge status: Home / Self Care | Payer: Medicare Other | Attending: Emergency Medicine | Admitting: Emergency Medicine

## 2016-03-04 ENCOUNTER — Encounter: Payer: Self-pay | Admitting: Emergency Medicine

## 2016-03-04 DIAGNOSIS — I10 Essential (primary) hypertension: Secondary | ICD-10-CM | POA: Diagnosis not present

## 2016-03-04 DIAGNOSIS — Z792 Long term (current) use of antibiotics: Secondary | ICD-10-CM | POA: Diagnosis not present

## 2016-03-04 DIAGNOSIS — F329 Major depressive disorder, single episode, unspecified: Secondary | ICD-10-CM | POA: Diagnosis not present

## 2016-03-04 DIAGNOSIS — I82412 Acute embolism and thrombosis of left femoral vein: Secondary | ICD-10-CM | POA: Diagnosis not present

## 2016-03-04 DIAGNOSIS — E785 Hyperlipidemia, unspecified: Secondary | ICD-10-CM | POA: Insufficient documentation

## 2016-03-04 DIAGNOSIS — M79672 Pain in left foot: Secondary | ICD-10-CM | POA: Diagnosis present

## 2016-03-04 DIAGNOSIS — N289 Disorder of kidney and ureter, unspecified: Secondary | ICD-10-CM | POA: Diagnosis not present

## 2016-03-04 DIAGNOSIS — Z79899 Other long term (current) drug therapy: Secondary | ICD-10-CM | POA: Insufficient documentation

## 2016-03-04 DIAGNOSIS — I82402 Acute embolism and thrombosis of unspecified deep veins of left lower extremity: Secondary | ICD-10-CM

## 2016-03-04 LAB — BASIC METABOLIC PANEL
Anion gap: 8 (ref 5–15)
BUN: 28 mg/dL — AB (ref 6–20)
CHLORIDE: 103 mmol/L (ref 101–111)
CO2: 22 mmol/L (ref 22–32)
Calcium: 9 mg/dL (ref 8.9–10.3)
Creatinine, Ser: 1.93 mg/dL — ABNORMAL HIGH (ref 0.61–1.24)
GFR calc Af Amer: 39 mL/min — ABNORMAL LOW (ref >60)
GFR calc non Af Amer: 34 mL/min — ABNORMAL LOW (ref >60)
Glucose, Bld: 81 mg/dL (ref 65–99)
POTASSIUM: 4.5 mmol/L (ref 3.5–5.1)
Sodium: 133 mmol/L — ABNORMAL LOW (ref 135–145)

## 2016-03-04 LAB — CBC WITH DIFFERENTIAL/PLATELET
Basophils Absolute: 0 10*3/uL (ref 0–0.1)
Basophils Relative: 1 %
EOS ABS: 0.2 10*3/uL (ref 0–0.7)
EOS PCT: 5 %
HCT: 35.5 % — ABNORMAL LOW (ref 40.0–52.0)
Hemoglobin: 11.8 g/dL — ABNORMAL LOW (ref 13.0–18.0)
LYMPHS PCT: 20 %
Lymphs Abs: 0.7 10*3/uL — ABNORMAL LOW (ref 1.0–3.6)
MCH: 34.8 pg — AB (ref 26.0–34.0)
MCHC: 33.2 g/dL (ref 32.0–36.0)
MCV: 104.8 fL — AB (ref 80.0–100.0)
MONO ABS: 0.5 10*3/uL (ref 0.2–1.0)
MONOS PCT: 16 %
Neutro Abs: 1.8 10*3/uL (ref 1.4–6.5)
Neutrophils Relative %: 58 %
PLATELETS: 109 10*3/uL — AB (ref 150–440)
RBC: 3.38 MIL/uL — AB (ref 4.40–5.90)
RDW: 13.5 % (ref 11.5–14.5)
WBC: 3.2 10*3/uL — AB (ref 3.8–10.6)

## 2016-03-04 LAB — PROTIME-INR
INR: 1.18
PROTHROMBIN TIME: 15.2 s — AB (ref 11.4–15.0)

## 2016-03-04 LAB — APTT: aPTT: 29 seconds (ref 24–36)

## 2016-03-04 MED ORDER — APIXABAN 5 MG PO TABS: ORAL_TABLET | ORAL | Status: DC

## 2016-03-04 MED ORDER — APIXABAN 5 MG PO TABS
10.0000 mg | ORAL_TABLET | ORAL | Status: AC
  Administered 2016-03-04: 10 mg via ORAL
  Filled 2016-03-04: qty 2

## 2016-03-04 MED ORDER — SODIUM CHLORIDE 0.9 % IV BOLUS (SEPSIS)
500.0000 mL | INTRAVENOUS | Status: AC
  Administered 2016-03-04: 500 mL via INTRAVENOUS

## 2016-03-04 NOTE — Discharge Instructions (Signed)
You have been seen in the Emergency Department (ED) today and diagnosed with a deep vein thrombosis (DVT), which is a blood clot in one of your veins.  As we discussed, we are starting you on a blood thinner.  Though these carry with them the risk of bleeding, the risks of not treating your DVT (stroke, a clot in your lungs, etc.) are greater than the risk of taking the medication.  Please follow up with the doctor listed in these papers as recommended regarding today's ED visit.    It is also very important that you follow-up with your regular doctor for some repeat blood work.  Your creatinine is elevated today at 1.9, and we gave you some IV fluids.  You should follow up with outpatient lab work to make sure your kidney function is stable.  Return to the ED if you have worsening pain, ANY trouble breathing, chest pain, or other new or worsening symptoms that concern you.   Deep Vein Thrombosis A deep vein thrombosis (DVT) is a blood clot (thrombus) that usually occurs in a deep, larger vein of the lower leg or the pelvis, or in an upper extremity such as the arm. These are dangerous and can lead to serious and even life-threatening complications if the clot travels to the lungs. A DVT can damage the valves in your leg veins so that instead of flowing upward, the blood pools in the lower leg. This is called post-thrombotic syndrome, and it can result in pain, swelling, discoloration, and sores on the leg. CAUSES A DVT is caused by the formation of a blood clot in your leg, pelvis, or arm. Usually, several things contribute to the formation of blood clots. A clot may develop when:  Your blood flow slows down.  Your vein becomes damaged in some way.  You have a condition that makes your blood clot more easily. RISK FACTORS A DVT is more likely to develop in:  People who are older, especially over 87 years of age.  People who are overweight (obese).  People who sit or lie still for a long  time, such as during long-distance travel (over 4 hours), bed rest, hospitalization, or during recovery from certain medical conditions like a stroke.  People who do not engage in much physical activity (sedentary lifestyle).  People who have chronic breathing disorders.  People who have a personal or family history of blood clots or blood clotting disease.  People who have peripheral vascular disease (PVD), diabetes, or some types of cancer.  People who have heart disease, especially if the person had a recent heart attack or has congestive heart failure.  People who have neurological diseases that affect the legs (leg paresis).  People who have had a traumatic injury, such as breaking a hip or leg.  People who have recently had major or lengthy surgery, especially on the hip, knee, or abdomen.  People who have had a central line placed inside a large vein.  People who take medicines that contain the hormone estrogen. These include birth control pills and hormone replacement therapy.  Pregnancy or during childbirth or the postpartum period.  Long plane flights (over 8 hours). SIGNS AND SYMPTOMS Symptoms of a DVT can include:   Swelling of your leg or arm, especially if one side is much worse.  Warmth and redness of your leg or arm, especially if one side is much worse.  Pain in your arm or leg. If the clot is in your leg, symptoms  may be more noticeable or worse when you stand or walk.  A feeling of pins and needles, if the clot is in the arm. The symptoms of a DVT that has traveled to the lungs (pulmonary embolism, PE) usually start suddenly and include:  Shortness of breath while active or at rest.  Coughing or coughing up blood or blood-tinged mucus.  Chest pain that is often worse with deep breaths.  Rapid or irregular heartbeat.  Feeling light-headed or dizzy.  Fainting.  Feeling anxious.  Sweating. There may also be pain and swelling in a leg if that is  where the blood clot started. These symptoms may represent a serious problem that is an emergency. Do not wait to see if the symptoms will go away. Get medical help right away. Call your local emergency services (911 in the [place] Do not drive yourself to the hospital. DIAGNOSIS Your health care provider will take a medical history and perform a physical exam. You may also have other tests, including:  Blood tests to assess the clotting properties of your blood.  Imaging tests, such as CT, ultrasound, MRI, X-ray, and other tests to see if you have clots anywhere in your body. TREATMENT After a DVT is identified, it can be treated. The type of treatment that you receive depends on many factors, such as the cause of your DVT, your risk for bleeding or developing more clots, and other medical conditions that you have. Sometimes, a combination of treatments is necessary. Treatment options may be combined and include:  Monitoring the blood clot with ultrasound.  Taking medicines by mouth, such as newer blood thinners (anticoagulants), thrombolytics, or warfarin.  Taking anticoagulant medicine by injection or through an IV tube.  Wearing compression stockings or using different types ofdevices.  Surgery (rare) to remove the blood clot or to place a filter in your abdomen to stop the blood clot from traveling to your lungs. Treatments for a DVT are often divided into immediate treatment and long-term treatment (up to 3 months after DVT). You can work with your health care provider to choose the treatment program that is best for you. HOME CARE INSTRUCTIONS If you are taking a newer oral anticoagulant:  Take the medicine every single day at the same time each day.  Understand what foods and drugs interact with this medicine.  Understand that there are no regular blood tests required when using this medicine.  Understand the side effects of this medicine, including excessive bruising or  bleeding. Ask your health care provider or pharmacist about other possible side effects. If you are taking warfarin:  Understand how to take warfarin and know which foods can affect how warfarin works in Veterinary surgeon.  Understand that it is dangerous to take too much or too little warfarin. Too much warfarin increases the risk of bleeding. Too little warfarin continues to allow the risk for blood clots.  Follow your PT and INR blood testing schedule. The PT and INR results allow your health care provider to adjust your dose of warfarin. It is very important that you have your PT and INR tested as often as told by your health care provider.  Avoid major changes in your diet, or tell your health care provider before you change your diet. Arrange a visit with a registered dietitian to answer your questions. Many foods, especially foods that are high in vitamin K, can interfere with warfarin and affect the PT and INR results. Eat a consistent amount of foods that  are high in vitamin K, such as:  Spinach, kale, broccoli, cabbage, collard greens, turnip greens, Brussels sprouts, peas, cauliflower, seaweed, and parsley.  Beef liver and pork liver.  Green tea.  Soybean oil.  Tell your health care provider about any and all medicines, vitamins, and supplements that you take, including aspirin and other over-the-counter anti-inflammatory medicines. Be especially cautious with aspirin and anti-inflammatory medicines. Do not take those before you ask your health care provider if it is safe to do so. This is important because many medicines can interfere with warfarin and affect the PT and INR results.  Do not start or stop taking any over-the-counter or prescription medicine unless your health care provider or pharmacist tells you to do so. If you take warfarin, you will also need to do these things:  Hold pressure over cuts for longer than usual.  Tell your dentist and other health care providers that  you are taking warfarin before you have any procedures in which bleeding may occur.  Avoid alcohol or drink very small amounts. Tell your health care provider if you change your alcohol intake.  Do not use tobacco products, including cigarettes, chewing tobacco, and e-cigarettes. If you need help quitting, ask your health care provider.  Avoid contact sports. General Instructions  Take over-the-counter and prescription medicines only as told by your health care provider. Anticoagulant medicines can have side effects, including easy bruising and difficulty stopping bleeding. If you are prescribed an anticoagulant, you will also need to do these things:  Hold pressure over cuts for longer than usual.  Tell your dentist and other health care providers that you are taking anticoagulants before you have any procedures in which bleeding may occur.  Avoid contact sports.  Wear a medical alert bracelet or carry a medical alert card that says you have had a PE.  Ask your health care provider how soon you can go back to your normal activities. Stay active to prevent new blood clots from forming.  Make sure to exercise while traveling or when you have been sitting or standing for a long period of time. It is very important to exercise. Exercise your legs by walking or by tightening and relaxing your leg muscles often. Take frequent walks.  Wear compression stockings as told by your health care provider to help prevent more blood clots from forming.  Do not use tobacco products, including cigarettes, chewing tobacco, and e-cigarettes. If you need help quitting, ask your health care provider.  Keep all follow-up appointments with your health care provider. This is important. PREVENTION Take these actions to decrease your risk of developing another DVT:  Exercise regularly. For at least 30 minutes every day, engage in:  Activity that involves moving your arms and legs.  Activity that encourages  good blood flow through your body by increasing your heart rate.  Exercise your arms and legs every hour during long-distance travel (over 4 hours). Drink plenty of water and avoid drinking alcohol while traveling.  Avoid sitting or lying in bed for long periods of time without moving your legs.  Maintain a weight that is appropriate for your height. Ask your health care provider what weight is healthy for you.  If you are a woman who is over 14 years of age, avoid unnecessary use of medicines that contain estrogen. These include birth control pills.  Do not smoke, especially if you take estrogen medicines. If you need help quitting, ask your health care provider. If you are hospitalized, prevention  measures may include:  Early walking after surgery, as soon as your health care provider says that it is safe.  Receiving anticoagulants to prevent blood clots.If you cannot take anticoagulants, other options may be available, such as wearing compression stockings or using different types of devices. SEEK IMMEDIATE MEDICAL CARE IF:  You have new or increased pain, swelling, or redness in an arm or leg.  You have numbness or tingling in an arm or leg.  You have shortness of breath while active or at rest.  You have chest pain.  You have a rapid or irregular heartbeat.  You feel light-headed or dizzy.  You cough up blood.  You notice blood in your vomit, bowel movement, or urine. These symptoms may represent a serious problem that is an emergency. Do not wait to see if the symptoms will go away. Get medical help right away. Call your local emergency services (911 in the [place] Do not drive yourself to the hospital.   This information is not intended to replace advice given to you by your health care provider. Make sure you discuss any questions you have with your health care provider.   Document Released: 09/03/2005 Document Revised: 05/25/2015 Document Reviewed: 12/29/2014 Elsevier  Interactive Patient Education 2016 Else[employer] Inc[employer]  Creatinine Clearance Test WHY AM I HAVING THIS TEST? Creatinine clearance (CrCl) is a test that measures how well the kidneys are working.  WHAT KIND OF SAMPLE IS TAKEN? This test may require:  A collection of urine samples over a 24-hour time period.  A blood sample. A blood sample is typically collected by inserting a needle into a vein. WILL I NEED TO COLLECT SAMPLES AT HOME? Your health care provider will provide you with instructions and supplies to collect samples of your urine at home. When collecting urine samples at home, make sure you:  Collect urine in the sterile containers provided to you by the lab.  Discard the first urine sample of the 24-hour period.  Refrigerate all collected samples.  Do not put toilet paper in the urine collection containers.  Collect your urine sample before having a bowel movement. Do not contaminate urine samples with stool.  Collect your last urine sample as close to the end of the 24-hour period as possible. HOW DO I PREPARE FOR THE TEST? Drink plenty of fluids during the 24-hour testing period. Your lab or health care provider may also instruct you to avoid the following during the 24-hour testing period:  Vigorous exercise.  Eating cooked meat.  Drinking tea or coffee.  Taking medicines. Follow your health care provider's instructions carefully. WHAT ARE THE REFERENCE RANGES? Reference ranges are considered healthy ranges established after testing a large group of healthy people. Reference ranges may vary among different people, labs, and hospitals. It is your responsibility to obtain your test results. Ask the lab or department performing the test when and how you will get your results. Reference Ranges for Adults Younger than 71 Years  Male: 107-139 mL/min or 1.78-2.32 mL/sec (SI units).  Male: 87-107 mL/min or 1.45-1.78 mL/sec (SI units). Values decrease 6.5 mL/min per decade  of life after age 4. Reference Ranges for Newborns 40-65 mL/min. WHAT DO THE RESULTS MEAN? Levels above reference ranges are associated with:  Exercise.  Pregnancy.  Syndromes that increase cardiac output. Levels below reference ranges are associated with:  Impaired kidney function.  Conditions that decrease how well your kidneys function. Talk with your health care provider to discuss your results, treatment options, and  if necessary, the need for more tests. Talk with your health care provider if you have any questions about your results.   This information is not intended to replace advice given to you by your health care provider. Make sure you discuss any questions you have with your health care provider.   Document Released: 09/26/2004 Document Revised: 09/24/2014 Document Reviewed: 01/28/2014 Elsevier Interactive Patient Education [employer].

## 2016-03-04 NOTE — ED Notes (Signed)
To US via stretcher.  AAOx3.  Skin warm and dry.  NAD 

## 2016-03-04 NOTE — ED Notes (Signed)
AAOx3.  Skin warm and dry.  NAD 

## 2016-03-04 NOTE — ED Notes (Addendum)
C/O left calf and left ankle pain "for a while".  Patient states he had been going to physical therapy twice a week for pain, and pain improved.  Patient stopped PT about 2-3 weeks ago and pain has returned.  Patient C/O left calf pain that radiates down to ankle and foot.  Describes pain as 'nagging" and pain becomes sharp when bearing weight.  Patient states pain starts in the arch of his foot and radiates up toward calf.

## 2016-03-04 NOTE — ED Notes (Signed)
Ambulated to bathroom.  Tolerated well. Continue to monitor.

## 2016-03-04 NOTE — ED Provider Notes (Signed)
[facility] Emergency Department Provider Note  ____________________________________________  Time seen: Approximately 12:00 PM  I have reviewed the triage vital signs and the nursing notes.   HISTORY  Chief Complaint Leg Pain    HPI Nathan Thomas is a 70 y.o. male who presents for evaluation of pain in his left lower extremity.  He has had this pain for quite a while and was doing physical therapy twice a week which dramatically improved the pain essentially medically go away.  He stopped doing the therapy 2-3 weeks ago and about a week ago the pain has returned.  He describes it as a sharp and aching pain from the foot that radiates up through the left calf.  It is worse with weight bearing although he still is able to ambulate without difficulty.  He has spoken with his regular doctor and is planning to start back with the physical therapy, but he was told that he may have a blood clot and that he should get that checked out before he starts the therapy again.  It is unclear what made him come in on a Sunday to the emergency department for evaluation rather than having outpatient study performed as he is not in any acute distress at this time and is ambulatory without difficulty, but it seems that he is worried about the progression of the symptoms since stopping therapy.  He denies fever/chills, chest pain, shortness of breath, nausea, vomiting, diarrhea.  He denies any numbness or tingling in any extremities.  He has no pain in any extremity other than as described above.  He describes the pain as moderate in intensity.  Past Medical History  Diagnosis Date  . Hypertension   . Hyperlipidemia   . GERD (gastroesophageal reflux disease)   . Major depressive disorder [facility])     Patient Active Problem List   Diagnosis Date Noted  . Fever 04/02/2015    Past Surgical History  Procedure Laterality Date  . Gastrectomy      Current Outpatient Rx  Name  Route   Sig  Dispense  Refill  . atorvastatin (LIPITOR) 20 MG tablet   Oral   Take 20 mg by mouth daily.         Marland Kitchen doxazosin (CARDURA) 1 MG tablet   Oral   Take 1 mg by mouth daily.         . ferrous sulfate 325 (65 FE) MG tablet   Oral   Take 325 mg by mouth 2 (two) times daily with a meal.         . folic acid (FOLVITE) 1 MG tablet   Oral   Take 1 mg by mouth daily.         Marland Kitchen omeprazole (PRILOSEC) 20 MG capsule   Oral   Take 20 mg by mouth daily.         Marland Kitchen oxyCODONE-acetaminophen (PERCOCET/ROXICET) 5-325 MG tablet   Oral   Take by mouth every 4 (four) hours as needed for severe pain.         . phosphorus (K PHOS NEUTRAL) 155-852-130 MG tablet   Oral   Take by mouth 4 (four) times daily.         . sertraline (ZOLOFT) 50 MG tablet   Oral   Take 50 mg by mouth daily.         Marland Kitchen sulfamethoxazole-trimethoprim (BACTRIM DS,SEPTRA DS) 800-160 MG tablet   Oral   Take 2 tablets by mouth every 8 (eight)  hours.         . thiamine (VITAMIN B-1) 50 MG tablet   Oral   Take 50 mg by mouth daily.         Marland Kitchen apixaban (ELIQUIS) 5 MG TABS tablet      Take 10 mg (2 tablets) twice daily for 7 days.  Then take 5 mg (1 tablet) twice daily continuously.   74 tablet   0     Allergies Review of patient's allergies indicates no known allergies.  No family history on file.  Social History Social History  Substance Use Topics  . Smoking status: Never Smoker   . Smokeless tobacco: None  . Alcohol Use: No    Review of Systems Constitutional: No fever/chills Eyes: No visual changes. ENT: No sore throat. Cardiovascular: Denies chest pain. Respiratory: Denies shortness of breath. Gastrointestinal: No abdominal pain.  No nausea, no vomiting.  No diarrhea.  No constipation. Genitourinary: Negative for dysuria. Musculoskeletal: Negative for back pain.  Pain in left foot radiating into left calf Skin: Negative for rash. Neurological: Negative for headaches, focal  weakness or numbness.  10-point ROS otherwise negative.  ____________________________________________   PHYSICAL EXAM:  VITAL SIGNS: ED Triage Vitals  Enc Vitals Group     BP 03/04/16 1123 121/69 mmHg     Pulse Rate 03/04/16 1123 71     Resp 03/04/16 1123 16     Temp 03/04/16 1123 98 F (36.7 C)     Temp Source 03/04/16 1123 Oral     SpO2 03/04/16 1123 98 %     Weight 03/04/16 1123 180 lb (81.647 kg)     Height 03/04/16 1123 6\' 1"  (1.854 m)     Head Cir --      Peak Flow --      Pain Score 03/04/16 1124 0     Pain Loc --      Pain Edu? --      Excl. in [place]? --     Constitutional: Alert and oriented. Well appearing and in no acute distress. Eyes: Conjunctivae are normal. PERRL. EOMI. Head: Atraumatic. Nose: No congestion/rhinnorhea. Mouth/Throat: Mucous membranes are moist.  Oropharynx non-erythematous. Neck: No stridor.  No meningeal signs.   Cardiovascular: Normal rate, regular rhythm. Good peripheral circulation. Grossly normal heart sounds.   Respiratory: Normal respiratory effort.  No retractions. Lungs CTAB. Gastrointestinal: Soft and nontender. No distention.  Musculoskeletal: No lower extremity tenderness nor edema. No gross deformities of extremities.  Neurovascularly intact with easily palpable pulses in the left and right feet.  Equal capillary refill, both distal extremities are warm to the touch with good perfusion. Neurologic:  Normal speech and language. No gross focal neurologic deficits are appreciated.  Skin:  Skin is warm, dry and intact. No rash noted. Psychiatric: Mood and affect are normal. Speech and behavior are normal.  ____________________________________________   LABS (all labs ordered are listed, but only abnormal results are displayed)  Labs Reviewed  CBC WITH DIFFERENTIAL/PLATELET - Abnormal; Notable for the following:    WBC 3.2 (*)    RBC 3.38 (*)    Hemoglobin 11.8 (*)    HCT 35.5 (*)    MCV 104.8 (*)    MCH 34.8 (*)    Platelets  109 (*)    Lymphs Abs 0.7 (*)    All other components within normal limits  PROTIME-INR - Abnormal; Notable for the following:    Prothrombin Time 15.2 (*)    All other components within normal limits  BASIC METABOLIC PANEL - Abnormal; Notable for the following:    Sodium 133 (*)    BUN 28 (*)    Creatinine, Ser 1.93 (*)    GFR calc non Af Amer 34 (*)    GFR calc Af Amer 39 (*)    All other components within normal limits  APTT   ____________________________________________  EKG  None ____________________________________________  RADIOLOGY   US Venous Img Lower Unilateral Left  03/04/2016  CLINICAL DATA:  Progressive pain in the left lower extremity for 1 week. EXAM: LEFT LOWER EXTREMITY VENOUS DOPPLER ULTRASOUND TECHNIQUE: Gray-scale sonography with graded compression, as well as color Doppler and duplex ultrasound were performed to evaluate the lower extremity deep venous systems from the level of the common femoral vein and including the common femoral, femoral, profunda femoral, popliteal and calf veins including the posterior tibial, peroneal and gastrocnemius veins when visible. The superficial great saphenous vein was also interrogated. Spectral Doppler was utilized to evaluate flow at rest and with distal augmentation maneuvers in the common femoral, femoral and popliteal veins. COMPARISON:  None. FINDINGS: Contralateral Common Femoral Vein: Respiratory phasicity is normal and symmetric with the symptomatic side. No evidence of thrombus. Normal compressibility. Common Femoral Vein: No evidence of thrombus. Normal compressibility, respiratory phasicity and response to augmentation. Saphenofemoral Junction: No evidence of thrombus. Normal compressibility and flow on color Doppler imaging. Profunda Femoral Vein: No evidence of thrombus. Normal compressibility and flow on color Doppler imaging. Femoral Vein: There is nonocclusive thrombus extending from the mid left superficial femoral  vein through the popliteal vein into the calf. Clot is noncompressive. Popliteal Vein: Nonocclusive noncompressible thrombus throughout the popliteal vein. Calf Veins: Nonocclusive thrombus in the popliteal, posterior tibial and peroneal veins. Superficial Great Saphenous Vein: No evidence of thrombus. Normal compressibility and flow on color Doppler imaging. Venous Reflux:  None. Other Findings:  None. IMPRESSION: 1. There is deep venous thrombosis from the mid left femoral vein through the popliteal vein into the calf veins. Electronically Signed   By: Lajean Manes M.D.   On: 03/04/2016 12:31    ____________________________________________   PROCEDURES  Procedure(s) performed:   Procedures   ____________________________________________   INITIAL IMPRESSION / ASSESSMENT AND PLAN / ED COURSE  Pertinent labs & imaging results that were available during my care of the patient were reviewed by me and considered in my medical decision making (see chart for details).  The patient does have a DVT in his left lower extremity.  It is nonocclusive and he has good vascular supply.  My plan was to start him on Eliquis and discharge him, so I obtained baseline labs.  His creatinine is elevated significantly compared to his last lab value on record one year ago, but his creatinine is still barely below 2.  According to up-to-date there is no dosing modification necessary for Eliquis.  I do not think the patient needs to be admitted for what may be chronic renal insufficiency, but I will give him a 500 mL bolus and I encouraged him to follow up as soon as possible with his outpatient doctor for repeat lab tests to make sure his creatinine is stable.  Additionally he will follow up with vascular surgery regarding his DVT.  I gave my usual and customary return precautions.  He received his first dose of Eliquis in the ED and I gave him the Eliquis starter pack information in case it helps him with the cost of  the medication.   ____________________________________________  FINAL  CLINICAL IMPRESSION(S) / ED DIAGNOSES  Final diagnoses:  Acute DVT (deep venous thrombosis), left  Renal insufficiency     MEDICATIONS GIVEN DURING THIS VISIT:  Medications  sodium chloride 0.9 % bolus 500 mL   apixaban (ELIQUIS) tablet 10 mg      NEW OUTPATIENT MEDICATIONS STARTED DURING THIS VISIT:  New Prescriptions   APIXABAN (ELIQUIS) 5 MG TABS TABLET    Take 10 mg (2 tablets) twice daily for 7 days.  Then take 5 mg (1 tablet) twice daily continuously.      Note:  This document was prepared using Dragon voice recognition software and may include unintentional dictation errors.   Hinda Kehr, MD 03/04/16 1414

## 2016-08-27 IMAGING — US US EXTREM LOW VENOUS*L*
1 series · 13 of 24 positions shown · non-contrast
Comparison: None.

CLINICAL DATA: Progressive pain in the left lower extremity for 1
week.



[Series 1: us extrem low venous*left* · 0.08mm/px · 13 of 38 slices shown]
[im 1/38]
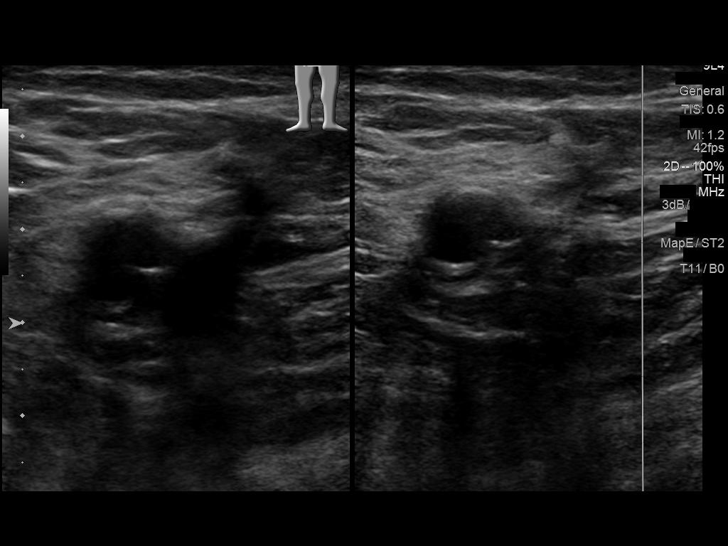
[im 4/38]
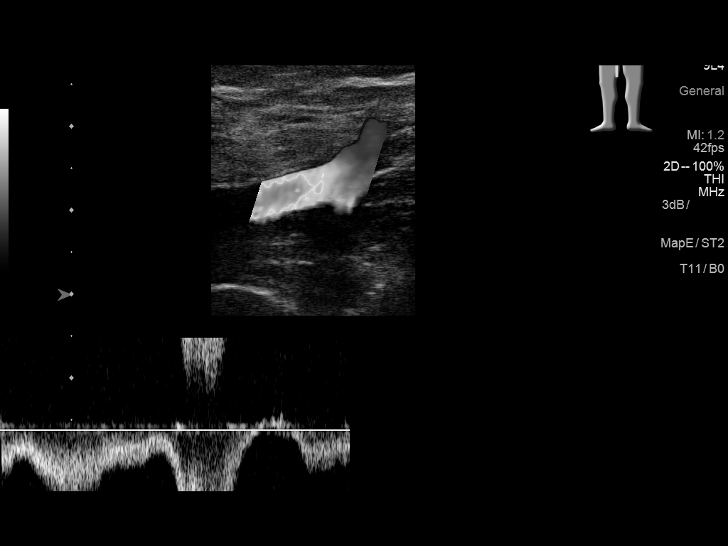
[im 7/38]
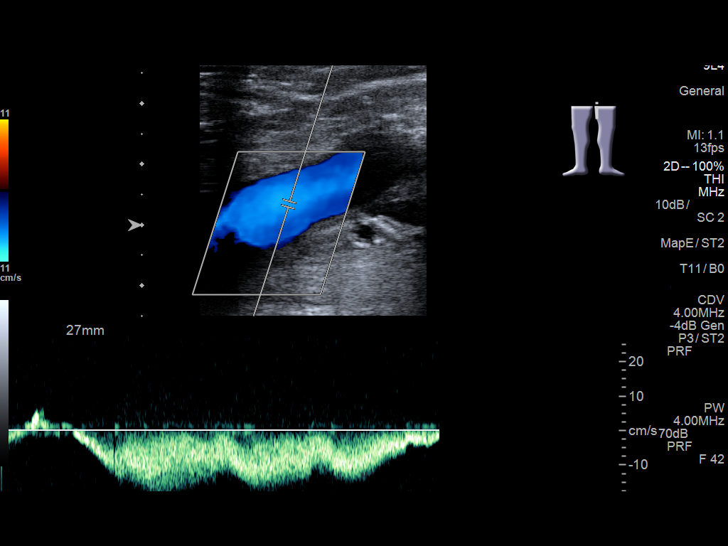
[im 10/38]
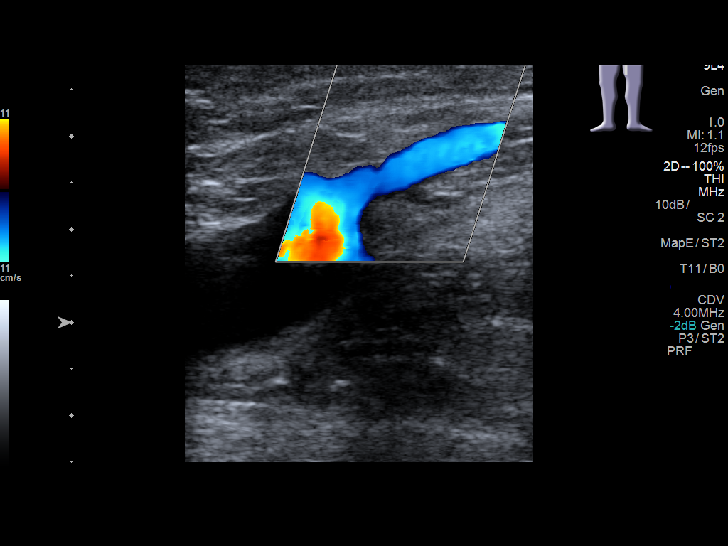
[im 13/38]
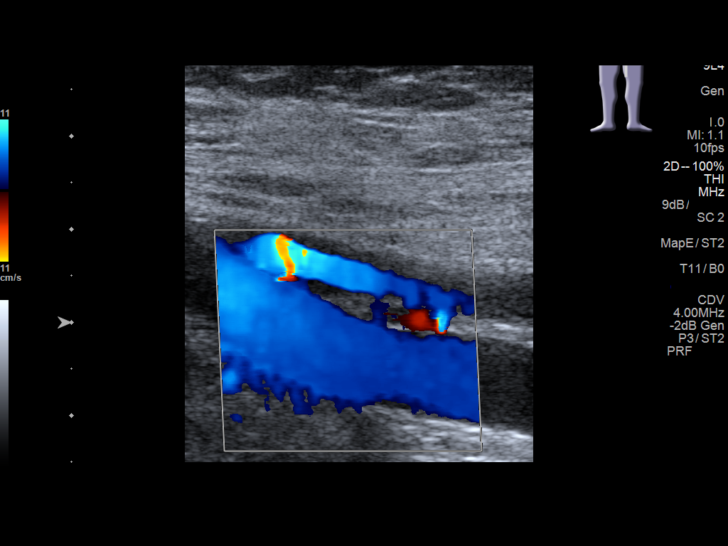
[im 17/38]
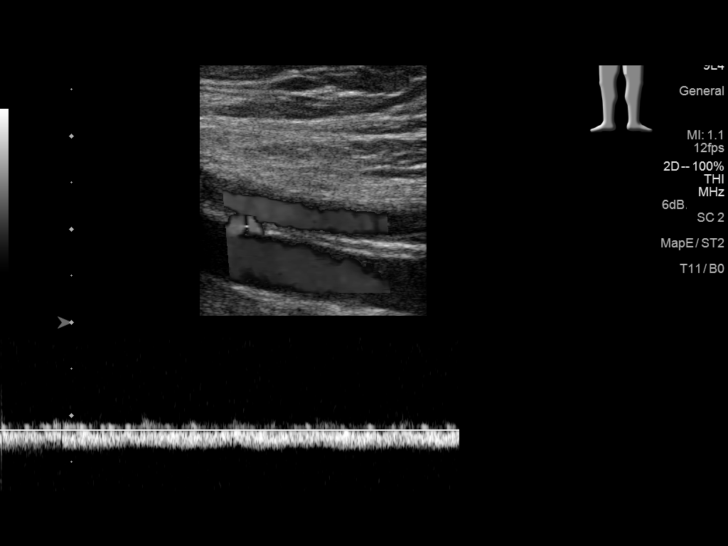
[im 20/38]
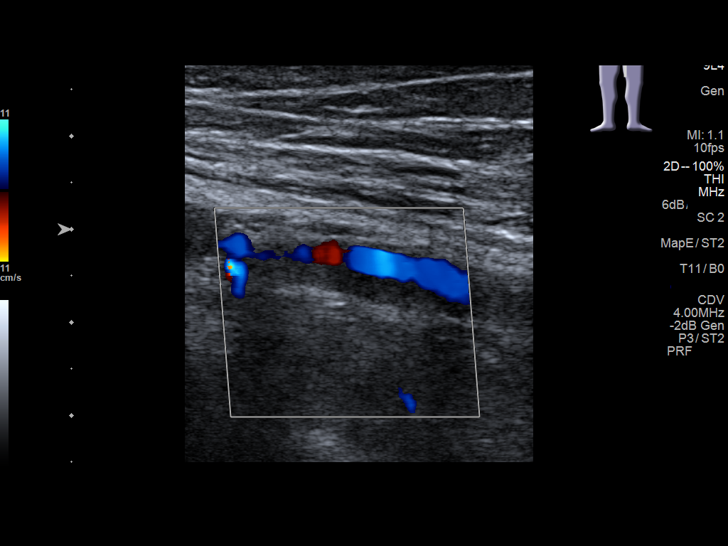
[im 21/38]
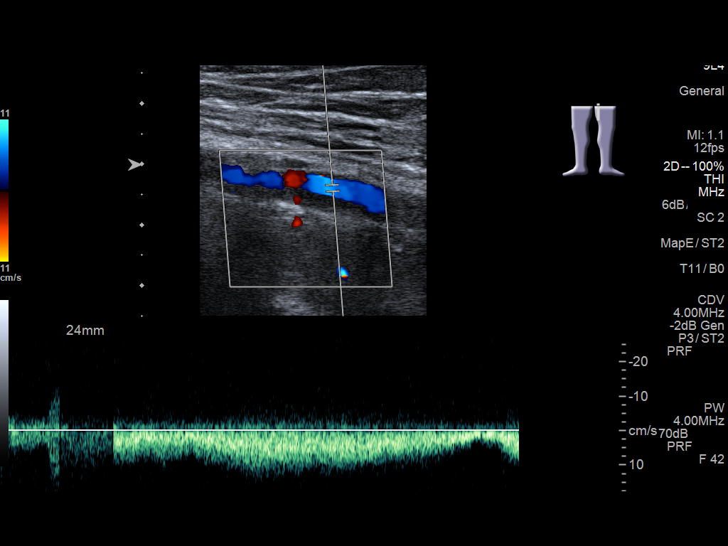
[im 25/38]
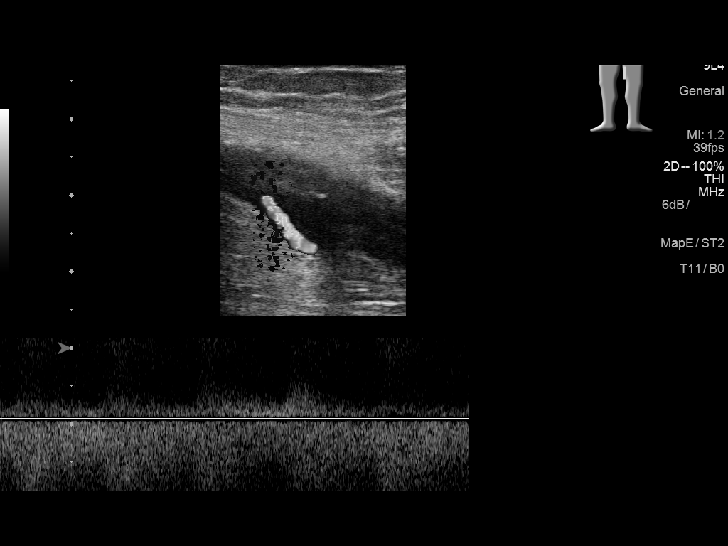
[im 28/38]
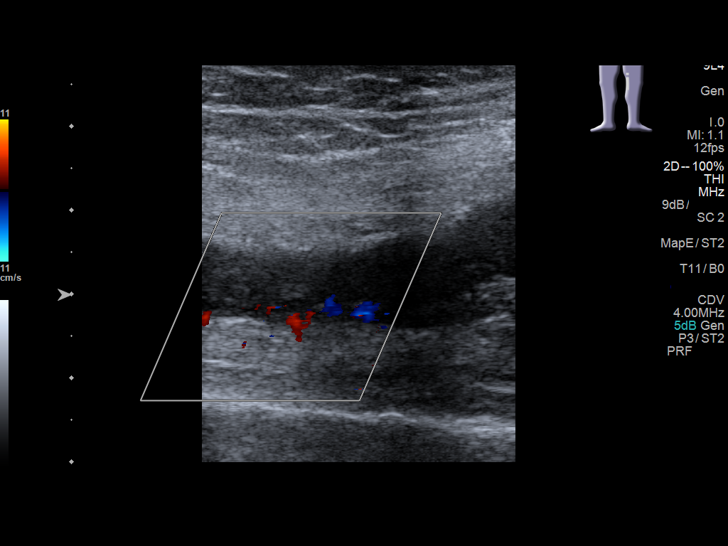
[im 31/38]
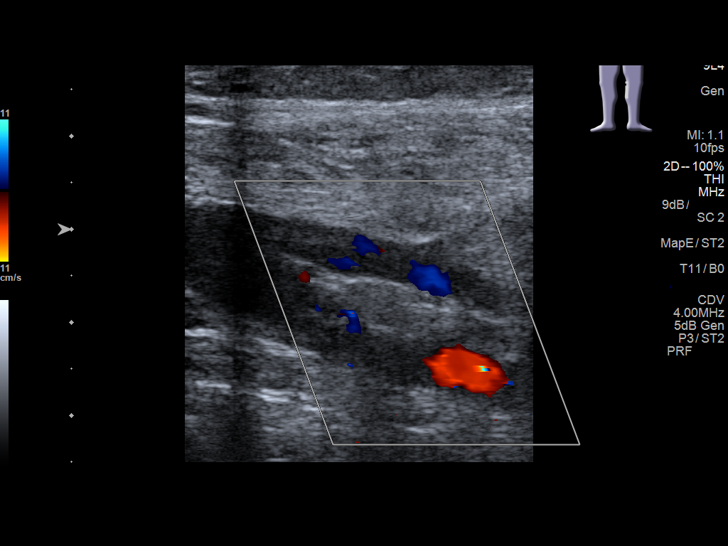
[im 34/38]
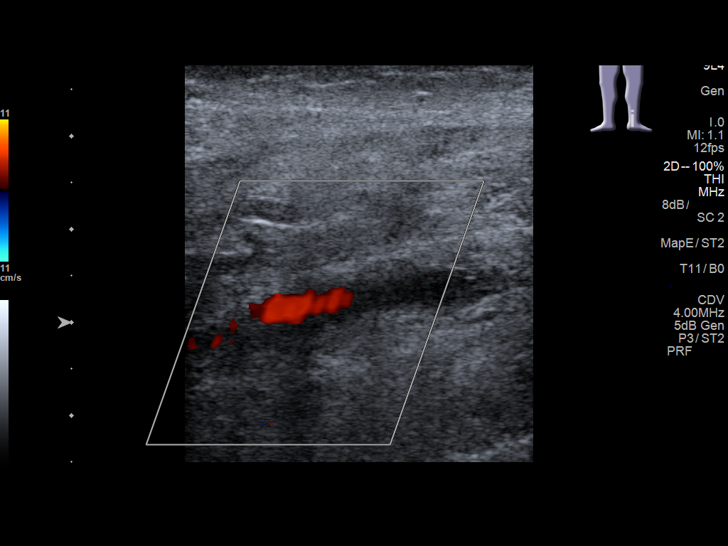
[im 38/38]
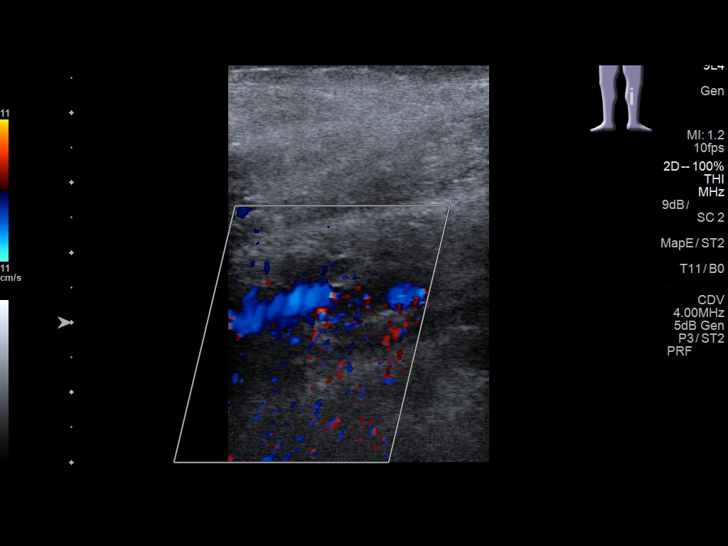

[13 of 24 positions shown; findings below may reference images not displayed]

FINDINGS: Contralateral Common Femoral Vein: Respiratory phasicity is normal
and symmetric with the symptomatic side. No evidence of thrombus.
Normal compressibility.

Common Femoral Vein: No evidence of thrombus. Normal
compressibility, respiratory phasicity and response to augmentation.

Saphenofemoral Junction: No evidence of thrombus. Normal
compressibility and flow on color Doppler imaging.

Profunda Femoral Vein: No evidence of thrombus. Normal
compressibility and flow on color Doppler imaging.

Femoral Vein: There is nonocclusive thrombus extending from the mid
left superficial femoral vein through the popliteal vein into the
calf. Clot is noncompressive.

Popliteal Vein: Nonocclusive noncompressible thrombus throughout the
popliteal vein.

Calf Veins: Nonocclusive thrombus in the popliteal, posterior tibial
and peroneal veins.

Superficial Great Saphenous Vein: No evidence of thrombus. Normal
compressibility and flow on color Doppler imaging.

Venous Reflux:  None.

Other Findings:  None.
IMPRESSION: 1. There is deep venous thrombosis from the mid left femoral vein
through the popliteal vein into the calf veins.

## 2020-06-14 ENCOUNTER — Ambulatory Visit: Payer: Medicare Other | Admitting: Dermatology

## 2020-11-03 ENCOUNTER — Other Ambulatory Visit: Payer: Self-pay

## 2020-11-03 ENCOUNTER — Ambulatory Visit (INDEPENDENT_AMBULATORY_CARE_PROVIDER_SITE_OTHER): Payer: Medicare Other | Admitting: Dermatology

## 2020-11-03 DIAGNOSIS — L03312 Cellulitis of back [any part except buttock]: Secondary | ICD-10-CM

## 2020-11-03 DIAGNOSIS — L0291 Cutaneous abscess, unspecified: Secondary | ICD-10-CM

## 2020-11-03 DIAGNOSIS — L02212 Cutaneous abscess of back [any part, except buttock]: Secondary | ICD-10-CM

## 2020-11-03 NOTE — Progress Notes (Signed)
   New Patient Visit  Subjective  Nathan Thomas is a 75 y.o. male who presents for the following: Cyst (Back, ~20 yrs, popped a few days ago is draining).  The following portions of the chart were reviewed this encounter and updated as appropriate:   Allergies  Meds  Problems  Med Hx  Surg Hx  Fam Hx     Review of Systems:  No other skin or systemic complaints except as noted in HPI or Assessment and Plan.  Objective  Well appearing patient in no apparent distress; mood and affect are within normal limits.  A focused examination was performed including back. Relevant physical exam findings are noted in the Assessment and Plan.  Objective  Spinal mid back: 4.0cm nodule with opening and drainage   Assessment & Plan  Abscess from inflamed ruptured cyst with associated cellulitis Spinal mid back I&D today  Start Doxycycyline 100mg  1 po bid with food and drink for 10 days Start Mupirocin oint qd  Incision and Drainage - Spinal mid back Location: spinal mid back  Informed Consent: Discussed risks (permanent scarring, light or dark discoloration, infection, pain, bleeding, bruising, redness, damage to adjacent structures, and recurrence of the lesion) and benefits of the procedure, as well as the alternatives.  Informed consent was obtained.  Preparation: The area was prepped with alcohol.  Anesthesia: Lidocaine 1% with epinephrine  Procedure Details: An incision was made overlying the lesion. The lesion drained pus and keratin, pus, odor.  A large amount of fluid was drained.    Antibiotic ointment and a sterile pressure dressing were applied. The patient tolerated procedure well.  Total number of lesions drained: 1  The patient was advised he would likely need this area excised in the future once it heals as there will likely be recurrent cyst.  He understands.  Plan: The patient was instructed on post-op care. Recommend OTC analgesia as needed for pain.  Return in  about 6 weeks (around 12/15/2020) for Abcess f/u.  I, Othelia Pulling, RMA, am acting as scribe for Sarina Ser, MD .  Documentation: I have reviewed the above documentation for accuracy and completeness, and I agree with the above.  Sarina Ser, MD

## 2020-11-07 ENCOUNTER — Encounter: Payer: Self-pay | Admitting: Dermatology

## 2020-12-15 ENCOUNTER — Ambulatory Visit (INDEPENDENT_AMBULATORY_CARE_PROVIDER_SITE_OTHER): Payer: Medicare Other | Admitting: Dermatology

## 2020-12-15 ENCOUNTER — Other Ambulatory Visit: Payer: Self-pay

## 2020-12-15 DIAGNOSIS — L02219 Cutaneous abscess of trunk, unspecified: Secondary | ICD-10-CM | POA: Diagnosis not present

## 2020-12-15 DIAGNOSIS — L0291 Cutaneous abscess, unspecified: Secondary | ICD-10-CM

## 2020-12-15 NOTE — Progress Notes (Signed)
   Follow-Up Visit   Subjective  Nathan Thomas is a 75 y.o. male who presents for the following: Follow-up (F/u from an I&D at last visit. Pt states it is improved and not sore anymore. Pt states that it may still be draining. ).  The following portions of the chart were reviewed this encounter and updated as appropriate:  Allergies  Meds  Problems  Med Hx  Surg Hx  Fam Hx     Review of Systems: No other skin or systemic complaints except as noted in HPI or Assessment and Plan.  Objective  Well appearing patient in no apparent distress; mood and affect are within normal limits.  A focused examination was performed including back. Relevant physical exam findings are noted in the Assessment and Plan.  Objective  Mid Back: Post I&D  Some hyperpigmentation and a dilated pore.   Assessment & Plan  Abscess Mid Back S/P I&D - healing well.  Open pore and scarring and hyperpigmentation persistent but no obvious cyst recurrence yet. Cyst may recur and excision may be recommended in future.  Mupirocin applied and dressing changed. Can be left on for a few days and then left uncovered after removed.   Will recheck on f/u to see if anything else needs done.   Return in about 6 months (around 06/16/2021) for recheck abscess on mid back.   IHarriett Sine, CMA, am acting as scribe for Sarina Ser, MD.  Documentation: I have reviewed the above documentation for accuracy and completeness, and I agree with the above.  Sarina Ser, MD

## 2020-12-16 ENCOUNTER — Encounter: Payer: Self-pay | Admitting: Dermatology

## 2021-06-12 ENCOUNTER — Other Ambulatory Visit: Payer: Self-pay

## 2021-06-12 ENCOUNTER — Ambulatory Visit (INDEPENDENT_AMBULATORY_CARE_PROVIDER_SITE_OTHER): Payer: Medicare Other | Admitting: Dermatology

## 2021-06-12 DIAGNOSIS — L720 Epidermal cyst: Secondary | ICD-10-CM

## 2021-06-12 NOTE — Progress Notes (Signed)
   Follow-Up Visit   Subjective  Nathan Thomas is a 75 y.o. male who presents for the following: Follow-up (Patient here today to follow up for cyst at back. Patient had I&D > 6 months and did take a course of doxycycline. Patient advises the area does sometimes still drain if you put pressure on the area. ).  The following portions of the chart were reviewed this encounter and updated as appropriate:   Allergies  Meds  Problems  Med Hx  Surg Hx  Fam Hx     Review of Systems:  No other skin or systemic complaints except as noted in HPI or Assessment and Plan.  Objective  Well appearing patient in no apparent distress; mood and affect are within normal limits.  A focused examination was performed including back. Relevant physical exam findings are noted in the Assessment and Plan.  Mid Back (2) Subcutaneous nodule.    Assessment & Plan  Epidermal inclusion cyst  - persistent after rupture in past Mid Back  Benign-appearing. Exam most consistent with an epidermal inclusion cyst. Discussed that a cyst is a benign growth that can grow over time and sometimes get irritated or inflamed. Recommend observation if it is not bothersome. Discussed option of surgical excision to remove it if it is growing, symptomatic, or other changes noted. Please call for new or changing lesions so they can be evaluated.  Return for Surgery.  Graciella Belton, RMA, am acting as scribe for Sarina Ser, MD . Documentation: I have reviewed the above documentation for accuracy and completeness, and I agree with the above.  Sarina Ser, MD

## 2021-06-12 NOTE — Patient Instructions (Signed)
Pre-Operative Instructions  You are scheduled for a surgical procedure at [facility]. We recommend you read the following instructions. If you have any questions or concerns, please call the office at [phone].  Shower and wash the entire body with soap and water the day of your surgery paying special attention to cleansing at and around the planned surgery site.  Avoid aspirin or aspirin containing products at least fourteen (14) days prior to your surgical procedure and for at least one week (7 Days) after your surgical procedure. If you take aspirin on a regular basis for heart disease or history of stroke or for any other reason, we may recommend you continue taking aspirin but please notify us if you take this on a regular basis. Aspirin can cause more bleeding to occur during surgery as well as prolonged bleeding and bruising after surgery.   Avoid other nonsteroidal pain medications at least one week prior to surgery and at least one week prior to your surgery. These include medications such as Ibuprofen (Motrin, Advil and Nuprin), Naprosyn, Voltaren, Relafen, etc. If medications are used for therapeutic reasons, please inform us as they can cause increased bleeding or prolonged bleeding during and bruising after surgical procedures.   Please advise [place] if you are taking any "blood thinner" medications such as Coumadin or Dipyridamole or Plavix or similar medications. These cause increased bleeding and prolonged bleeding during procedures and bruising after surgical procedures. We may have to consider discontinuing these medications briefly prior to and shortly after your surgery if safe to do so.   Please inform us of all medications you are currently taking. All medications that are taken regularly should be taken the day of surgery as you always do. Nevertheless, we need to be informed of what medications you are taking prior to surgery to know whether they will affect the  procedure or cause any complications.   Please inform us of any medication allergies. Also inform us of whether you have allergies to Latex or rubber products or whether you have had any adverse reaction to Lidocaine or Epinephrine.  Please inform us of any prosthetic or artificial body parts such as artificial heart valve, joint replacements, etc., or similar condition that might require preoperative antibiotics.   We recommend avoidance of alcohol at least two weeks prior to surgery and continued avoidance for at least two weeks after surgery.   We recommend discontinuation of tobacco smoking at least two weeks prior to surgery and continued abstinence for at least two weeks after surgery.  Do not plan strenuous exercise, strenuous work or strenuous lifting for approximately four weeks after your surgery.   We request if you are unable to make your scheduled surgical appointment, please call us at least a week in advance or as soon as you are aware of a problem so that we can cancel or reschedule the appointment.   You MAY TAKE TYLENOL (acetaminophen) for pain as it is not a blood thinner.   PLEASE PLAN TO BE IN TOWN FOR TWO WEEKS FOLLOWING SURGERY, THIS IS IMPORTANT SO YOU CAN BE CHECKED FOR DRESSING CHANGES, SUTURE REMOVAL AND TO MONITOR FOR POSSIBLE COMPLICATIONS.    If you have any questions or concerns for your doctor, please call our main line at [phone] and press option 4 to reach your doctor's medical assistant. If no one answers, please leave a voicemail as directed and we will return your call as soon as possible. Messages left after 4 pm will  be answered the following business day.   You may also send [place] a message via [place]. We typically respond to MyChart messages within 1-2 business days.  For prescription refills, please ask your pharmacy to contact our office. Our fax number is [phone].  If you have an urgent issue when the clinic is closed that cannot wait until  the next business day, you can page your doctor at the number below.    Please note that while we do our best to be available for urgent issues outside of office hours, we are not available 24/7.   If you have an urgent issue and are unable to reach [place], you may choose to seek medical care at your doctor's office, retail clinic, urgent care center, or emergency room.  If you have a medical emergency, please immediately call 911 or go to the emergency department.  Pager Numbers  - Dr. Nehemiah Massed: [phone]  - Dr. Laurence Ferrari: [phone]  - Dr. Nicole Kindred: [phone]  In the event of inclement weather, please call our main line at [phone] for an update on the status of any delays or closures.  Dermatology Medication Tips: Please keep the boxes that topical medications come in in order to help keep track of the instructions about where and how to use these. Pharmacies typically print the medication instructions only on the boxes and not directly on the medication tubes.   If your medication is too expensive, please contact our office at [phone] option 4 or send [place] a message through [place].   We are unable to tell what your co-pay for medications will be in advance as this is different depending on your insurance coverage. However, we may be able to find a substitute medication at lower cost or fill out paperwork to get insurance to cover a needed medication.   If a prior authorization is required to get your medication covered by your insurance company, please allow [place] 1-2 business days to complete this process.  Drug prices often vary depending on where the prescription is filled and some pharmacies may offer cheaper prices.  The website www.goodrx.com contains coupons for medications through different pharmacies. The prices here do not account for what the cost may be with help from insurance (it may be cheaper with your insurance), but the website can give you the price if you did  not use any insurance.  - You can print the associated coupon and take it with your prescription to the pharmacy.  - You may also stop by our office during regular business hours and pick up a GoodRx coupon card.  - If you need your prescription sent electronically to a different pharmacy, notify our office through [facility] or by phone at [phone] option 4.

## 2021-06-15 ENCOUNTER — Encounter: Payer: Self-pay | Admitting: Dermatology

## 2021-07-18 ENCOUNTER — Telehealth: Payer: Self-pay

## 2021-07-18 ENCOUNTER — Encounter: Payer: Self-pay | Admitting: Dermatology

## 2021-07-18 ENCOUNTER — Ambulatory Visit (INDEPENDENT_AMBULATORY_CARE_PROVIDER_SITE_OTHER): Payer: Medicare Other | Admitting: Dermatology

## 2021-07-18 ENCOUNTER — Other Ambulatory Visit: Payer: Self-pay

## 2021-07-18 DIAGNOSIS — D492 Neoplasm of unspecified behavior of bone, soft tissue, and skin: Secondary | ICD-10-CM

## 2021-07-18 DIAGNOSIS — L720 Epidermal cyst: Secondary | ICD-10-CM | POA: Diagnosis not present

## 2021-07-18 MED ORDER — MUPIROCIN 2 % EX OINT: 1.0000 "application " | TOPICAL_OINTMENT | Freq: Every day | CUTANEOUS | 1 refills | Status: AC

## 2021-07-18 NOTE — Patient Instructions (Signed)
Wound Care Instructions  Cleanse wound gently with soap and water once a day then pat dry with clean gauze. Apply a thing coat of Petrolatum (petroleum jelly, "Vaseline") over the wound (unless you have an allergy to this). We recommend that you use a new, sterile tube of Vaseline. Do not pick or remove scabs. Do not remove the yellow or white "healing tissue" from the base of the wound.  Cover the wound with fresh, clean, nonstick gauze and secure with paper tape. You may use Band-Aids in place of gauze and tape if the would is small enough, but would recommend trimming much of the tape off as there is often too much. Sometimes Band-Aids can irritate the skin.  You should call the office for your biopsy report after 1 week if you have not already been contacted.  If you experience any problems, such as abnormal amounts of bleeding, swelling, significant bruising, significant pain, or evidence of infection, please call the office immediately.  FOR ADULT SURGERY PATIENTS: If you need something for pain relief you may take 1 extra strength Tylenol (acetaminophen) AND 2 Ibuprofen (200mg each) together every 4 hours as needed for pain. (do not take these if you are allergic to them or if you have a reason you should not take them.) Typically, you may only need pain medication for 1 to 3 days.   If you have any questions or concerns for your doctor, please call our main line at [phone] and press option 4 to reach your doctor's medical assistant. If no one answers, please leave a voicemail as directed and we will return your call as soon as possible. Messages left after 4 pm will be answered the following business day.   You may also send us a message via MyChart. We typically respond to MyChart messages within 1-2 business days.  For prescription refills, please ask your pharmacy to contact our office. Our fax number is [phone].  If you have an urgent issue when the clinic is closed that  cannot wait until the next business day, you can page your doctor at the number below.    Please note that while we do our best to be available for urgent issues outside of office hours, we are not available 24/7.   If you have an urgent issue and are unable to reach us, you may choose to seek medical care at your doctor's office, retail clinic, urgent care center, or emergency room.  If you have a medical emergency, please immediately call 911 or go to the emergency department.  Pager Numbers  - Dr. Kowalski: [phone]  - Dr. Moye: [phone]  - Dr. Stewart: [phone]  In the event of inclement weather, please call our main line at [phone] for an update on the status of any delays or closures.  Dermatology Medication Tips: Please keep the boxes that topical medications come in in order to help keep track of the instructions about where and how to use these. Pharmacies typically print the medication instructions only on the boxes and not directly on the medication tubes.   If your medication is too expensive, please contact our office at [phone] option 4 or send us a message through MyChart.   We are unable to tell what your co-pay for medications will be in advance as this is different depending on your insurance coverage. However, we may be able to find a substitute medication at lower cost or fill out paperwork to get insurance to cover a needed   medication.   If a prior authorization is required to get your medication covered by your insurance company, please allow us 1-2 business days to complete this process.  Drug prices often vary depending on where the prescription is filled and some pharmacies may offer cheaper prices.  The website www.goodrx.com contains coupons for medications through different pharmacies. The prices here do not account for what the cost may be with help from insurance (it may be cheaper with your insurance), but the website can give you the  price if you did not use any insurance.  - You can print the associated coupon and take it with your prescription to the pharmacy.  - You may also stop by our office during regular business hours and pick up a GoodRx coupon card.  - If you need your prescription sent electronically to a different pharmacy, notify our office through [facility] MyChart or by phone at [phone] option 4.   

## 2021-07-18 NOTE — Progress Notes (Signed)
   Follow-Up Visit   Subjective  Nathan Thomas is a 75 y.o. male who presents for the following: Cyst (Mid back, pt presents for excision).  The following portions of the chart were reviewed this encounter and updated as appropriate:   Allergies  Meds  Problems  Med Hx  Surg Hx  Fam Hx      Review of Systems:  No other skin or systemic complaints except as noted in HPI or Assessment and Plan.  Objective  Well appearing patient in no apparent distress; mood and affect are within normal limits.  A focused examination was performed including back. Relevant physical exam findings are noted in the Assessment and Plan.  Mid Back Cystic pap 4.2 x 2.0cm   Assessment & Plan  Neoplasm of skin Mid Back  Skin excision  Lesion length (cm):  4.2 Lesion width (cm):  2 Margin per side (cm):  0 Total excision diameter (cm):  4.2 Informed consent: discussed and consent obtained   Timeout: patient name, date of birth, surgical site, and procedure verified   Procedure prep:  Patient was prepped and draped in usual sterile fashion Prep type:  Isopropyl alcohol and povidone-iodine Anesthesia: the lesion was anesthetized in a standard fashion   Anesthetic:  1% lidocaine w/ epinephrine 1-100,000 buffered w/ 8.4% NaHCO3 (6cc lido w/ epi, 3cc bupivicaine, Tota 9cc) Instrument used: #10 blade   Hemostasis achieved with: pressure   Hemostasis achieved with comment:  Electrocautery Outcome: patient tolerated procedure well with no complications   Post-procedure details: sterile dressing applied and wound care instructions given   Dressing type: bandage, pressure dressing and bacitracin (Mupirocin)    Skin repair Complexity:  Complex Final length (cm):  5 Reason for type of repair: reduce tension to allow closure, reduce the risk of dehiscence, infection, and necrosis, reduce subcutaneous dead space and avoid a hematoma, allow closure of the large defect, preserve normal anatomy, preserve normal  anatomical and functional relationships and enhance both functionality and cosmetic results   Undermining: area extensively undermined   Undermining comment:  Undermining Defect 4.2cm Subcutaneous layers (deep stitches):  Suture size:  2-0 Suture type: Vicryl (polyglactin 910)   Subcutaneous suture technique: Inverted Dermal. Fine/surface layer approximation (top stitches):  Suture size:  3-0 Suture type: nylon   Stitches: horizontal mattress and simple running   Suture removal (days):  7 Hemostasis achieved with: pressure Outcome: patient tolerated procedure well with no complications   Post-procedure details: sterile dressing applied and wound care instructions given   Dressing type: bandage, pressure dressing and bacitracin (Mupirocin)   Additional details:  1 horizontal mattress and simple running  mupirocin ointment (BACTROBAN) 2 % Apply 1 application topically daily. Qd to excision site  Specimen 1 - Surgical pathology Differential Diagnosis: D48.5 Cyst vs other  Check Margins: No Cystic pap 4.2 x 2.0cm  Cyst vs other  Start Mupirocin oint qd to excision site  Return in about 1 week (around 07/25/2021) for suture removal.  I, Othelia Pulling, RMA, am acting as scribe for Sarina Ser, MD . Documentation: I have reviewed the above documentation for accuracy and completeness, and I agree with the above.  Sarina Ser, MD

## 2021-07-18 NOTE — Telephone Encounter (Signed)
Spoke to [name] at the [facility] and she said patient is doing fine after today's surgery.Nathan Thomas

## 2021-07-25 ENCOUNTER — Other Ambulatory Visit: Payer: Self-pay

## 2021-07-25 ENCOUNTER — Ambulatory Visit (INDEPENDENT_AMBULATORY_CARE_PROVIDER_SITE_OTHER): Payer: Medicare Other | Admitting: Dermatology

## 2021-07-25 ENCOUNTER — Encounter: Payer: Self-pay | Admitting: Dermatology

## 2021-07-25 DIAGNOSIS — Z4802 Encounter for removal of sutures: Secondary | ICD-10-CM

## 2021-07-25 DIAGNOSIS — L720 Epidermal cyst: Secondary | ICD-10-CM

## 2021-07-25 NOTE — Patient Instructions (Signed)

## 2021-07-25 NOTE — Progress Notes (Signed)
   Follow-Up Visit   Subjective  Nathan Thomas is a 75 y.o. male who presents for the following: post op./suture removal (Patient is here today for suture removal for pathology proven benign cyst of the mid back. Patient has not complaints other than some occasional itching.).  The following portions of the chart were reviewed this encounter and updated as appropriate:   Allergies  Meds  Problems  Med Hx  Surg Hx  Fam Hx     Review of Systems:  No other skin or systemic complaints except as noted in HPI or Assessment and Plan.  Objective  Well appearing patient in no apparent distress; mood and affect are within normal limits.  A focused examination was performed including the back. Relevant physical exam findings are noted in the Assessment and Plan.  Mid Back Healing excision site.   Assessment & Plan  Epidermal inclusion cyst Mid Back  Pathology proven benign cyst -   Encounter for Removal of Sutures - Incision site at the mid back is clean, dry and intact - Wound cleansed, sutures removed, wound cleansed and steri strips applied.  - Discussed pathology results showing a benign cyst.  - Patient advised to keep steri-strips dry until they fall off. - Scars remodel for a full year. - Once steri-strips fall off, patient can apply over-the-counter silicone scar cream each night to help with scar remodeling if desired. - Patient advised to call with any concerns or if they notice any new or changing lesions.  Return if symptoms worsen or fail to improve.  Luther Redo, CMA, am acting as scribe for Sarina Ser, MD . Documentation: I have reviewed the above documentation for accuracy and completeness, and I agree with the above.  Sarina Ser, MD

## 2024-10-15 ENCOUNTER — Observation Stay
Admission: EM | Admit: 2024-10-15 | Discharge: 2024-10-16 | Disposition: A | Discharge status: Home / Self Care | Attending: Obstetrics and Gynecology | Admitting: Obstetrics and Gynecology

## 2024-10-15 ENCOUNTER — Emergency Department

## 2024-10-15 ENCOUNTER — Other Ambulatory Visit: Payer: Self-pay

## 2024-10-15 DIAGNOSIS — I10 Essential (primary) hypertension: Secondary | ICD-10-CM | POA: Insufficient documentation

## 2024-10-15 DIAGNOSIS — Z79899 Other long term (current) drug therapy: Secondary | ICD-10-CM | POA: Diagnosis not present

## 2024-10-15 DIAGNOSIS — I2602 Saddle embolus of pulmonary artery with acute cor pulmonale: Secondary | ICD-10-CM

## 2024-10-15 DIAGNOSIS — F32A Depression, unspecified: Secondary | ICD-10-CM | POA: Insufficient documentation

## 2024-10-15 DIAGNOSIS — Z7901 Long term (current) use of anticoagulants: Secondary | ICD-10-CM | POA: Diagnosis not present

## 2024-10-15 DIAGNOSIS — R0602 Shortness of breath: Principal | ICD-10-CM

## 2024-10-15 DIAGNOSIS — K219 Gastro-esophageal reflux disease without esophagitis: Secondary | ICD-10-CM | POA: Insufficient documentation

## 2024-10-15 DIAGNOSIS — Z8719 Personal history of other diseases of the digestive system: Secondary | ICD-10-CM | POA: Diagnosis not present

## 2024-10-15 DIAGNOSIS — I82409 Acute embolism and thrombosis of unspecified deep veins of unspecified lower extremity: Secondary | ICD-10-CM | POA: Diagnosis not present

## 2024-10-15 DIAGNOSIS — Z86718 Personal history of other venous thrombosis and embolism: Secondary | ICD-10-CM | POA: Diagnosis not present

## 2024-10-15 DIAGNOSIS — E785 Hyperlipidemia, unspecified: Secondary | ICD-10-CM | POA: Insufficient documentation

## 2024-10-15 DIAGNOSIS — I50810 Right heart failure, unspecified: Secondary | ICD-10-CM

## 2024-10-15 DIAGNOSIS — I2699 Other pulmonary embolism without acute cor pulmonale: Secondary | ICD-10-CM | POA: Diagnosis not present

## 2024-10-15 LAB — CBC
HCT: 44.6 % (ref 39.0–52.0)
Hemoglobin: 14.7 g/dL (ref 13.0–17.0)
MCH: 31.7 pg (ref 26.0–34.0)
MCHC: 33 g/dL (ref 30.0–36.0)
MCV: 96.3 fL (ref 80.0–100.0)
Platelets: 134 10*3/uL — ABNORMAL LOW (ref 150–400)
RBC: 4.63 MIL/uL (ref 4.22–5.81)
RDW: 12.6 % (ref 11.5–15.5)
WBC: 5.1 10*3/uL (ref 4.0–10.5)
nRBC: 0 % (ref 0.0–0.2)

## 2024-10-15 LAB — APTT: aPTT: <22 s — ABNORMAL LOW (ref 24–36)

## 2024-10-15 LAB — COMPREHENSIVE METABOLIC PANEL WITH GFR
ALT: 12 U/L (ref 0–44)
AST: 22 U/L (ref 15–41)
Albumin: 4.1 g/dL (ref 3.5–5.0)
Alkaline Phosphatase: 86 U/L (ref 38–126)
Anion gap: 14 (ref 5–15)
BUN: 16 mg/dL (ref 8–23)
CO2: 20 mmol/L — ABNORMAL LOW (ref 22–32)
Calcium: 8.7 mg/dL — ABNORMAL LOW (ref 8.9–10.3)
Chloride: 105 mmol/L (ref 98–111)
Creatinine, Ser: 1.34 mg/dL — ABNORMAL HIGH (ref 0.61–1.24)
GFR, Estimated: 54 mL/min — ABNORMAL LOW
Glucose, Bld: 88 mg/dL (ref 70–99)
Potassium: 4.1 mmol/L (ref 3.5–5.1)
Sodium: 138 mmol/L (ref 135–145)
Total Bilirubin: 0.4 mg/dL (ref 0.0–1.2)
Total Protein: 6.7 g/dL (ref 6.5–8.1)

## 2024-10-15 LAB — TROPONIN T, HIGH SENSITIVITY
Troponin T High Sensitivity: 30 ng/L — ABNORMAL HIGH (ref 0–19)
Troponin T High Sensitivity: 29 ng/L — ABNORMAL HIGH (ref 0–19)

## 2024-10-15 LAB — PROTIME-INR
INR: 1.2 (ref 0.8–1.2)
Prothrombin Time: 16.3 s — ABNORMAL HIGH (ref 11.4–15.2)

## 2024-10-15 LAB — HEPARIN LEVEL (UNFRACTIONATED): Heparin Unfractionated: <0.1 [IU]/mL — ABNORMAL LOW (ref 0.30–0.70)

## 2024-10-15 MED ORDER — ACETAMINOPHEN 325 MG PO TABS: 650.0000 mg | ORAL_TABLET | Freq: Four times a day (QID) | ORAL | Status: DC | PRN

## 2024-10-15 MED ORDER — IOHEXOL 350 MG/ML SOLN
75.0000 mL | Freq: Once | INTRAVENOUS | Status: AC | PRN
  Administered 2024-10-15: 75 mL via INTRAVENOUS

## 2024-10-15 MED ORDER — PANTOPRAZOLE SODIUM 40 MG PO TBEC
40.0000 mg | DELAYED_RELEASE_TABLET | Freq: Two times a day (BID) | ORAL | Status: DC
  Administered 2024-10-15: 40 mg via ORAL
  Filled 2024-10-15: qty 1

## 2024-10-15 MED ORDER — POLYETHYLENE GLYCOL 3350 17 G PO PACK: 17.0000 g | PACK | Freq: Every day | ORAL | Status: DC | PRN

## 2024-10-15 MED ORDER — ACETAMINOPHEN 650 MG RE SUPP: 650.0000 mg | Freq: Four times a day (QID) | RECTAL | Status: DC | PRN

## 2024-10-15 MED ORDER — TIMOLOL MALEATE 0.5 % OP SOLN
1.0000 [drp] | Freq: Two times a day (BID) | OPHTHALMIC | Status: DC
  Administered 2024-10-15 – 2024-10-16 (×2): 1 [drp] via OPHTHALMIC
  Filled 2024-10-15: qty 5

## 2024-10-15 MED ORDER — ONDANSETRON HCL 4 MG/2ML IJ SOLN: 4.0000 mg | Freq: Four times a day (QID) | INTRAMUSCULAR | Status: DC | PRN

## 2024-10-15 MED ORDER — MAGNESIUM HYDROXIDE 400 MG/5ML PO SUSP: 30.0000 mL | Freq: Every day | ORAL | Status: DC | PRN

## 2024-10-15 MED ORDER — ONDANSETRON HCL 4 MG PO TABS: 4.0000 mg | ORAL_TABLET | Freq: Four times a day (QID) | ORAL | Status: DC | PRN

## 2024-10-15 MED ORDER — ALUM & MAG HYDROXIDE-SIMETH 200-200-20 MG/5ML PO SUSP: 30.0000 mL | Freq: Four times a day (QID) | ORAL | Status: DC | PRN

## 2024-10-15 MED ORDER — AMLODIPINE BESYLATE 5 MG PO TABS
5.0000 mg | ORAL_TABLET | Freq: Every day | ORAL | Status: DC
  Administered 2024-10-16: 5 mg via ORAL
  Filled 2024-10-15: qty 1

## 2024-10-15 MED ORDER — MORPHINE SULFATE (PF) 2 MG/ML IV SOLN: 2.0000 mg | INTRAVENOUS | Status: DC | PRN

## 2024-10-15 MED ORDER — OXYCODONE HCL 5 MG PO TABS: 5.0000 mg | ORAL_TABLET | ORAL | Status: DC | PRN

## 2024-10-15 MED ORDER — HEPARIN (PORCINE) 25000 UT/250ML-% IV SOLN
1600.0000 [IU]/h | INTRAVENOUS | Status: DC
  Administered 2024-10-15: 1600 [IU]/h via INTRAVENOUS
  Filled 2024-10-15: qty 250

## 2024-10-15 MED ORDER — HEPARIN BOLUS VIA INFUSION
6000.0000 [IU] | Freq: Once | INTRAVENOUS | Status: AC
  Administered 2024-10-15: 6000 [IU] via INTRAVENOUS
  Filled 2024-10-15: qty 6000

## 2024-10-15 NOTE — ED Provider Notes (Addendum)
 "  [facility] Provider Note    Event Date/Time   First MD Initiated Contact with Patient 10/15/24 1311     (approximate)   History   Shortness of Breath   HPI  Nathan Thomas is a 79 y.o. 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  Patient states that he is living at a nursing facility.  Today he was eating some cookies and when they came to check on him they were asking about his breathing and he was having some shortness of breath.  Felt like he might of just been full from eating the cookies and feels better now that he is digested.  Does state that he was diagnosed with a new DVT on Tuesday.  They sent him over to the emergency department for evaluation to make sure he did not have a blood clot.  States that they have ordered his Eliquis  but they have not yet started the Eliquis  since it has not arrived.  Denies any active chest discomfort or shortness of breath at this time.  Denies any nausea, vomiting or diaphoresis.  Denies any abdominal pain.     Physical Exam   Triage Vital Signs: ED Triage Vitals  Encounter Vitals Group     BP 10/15/24 1117 (!) 140/83     Girls Systolic BP Percentile --      Girls Diastolic BP Percentile --      Boys Systolic BP Percentile --      Boys Diastolic BP Percentile --      Pulse Rate 10/15/24 1117 70     Resp 10/15/24 1117 18     Temp 10/15/24 1117 98 F (36.7 C)     Temp src --      SpO2 10/15/24 1117 97 %     Weight 10/15/24 1118 220 lb (99.8 kg)     Height 10/15/24 1118 6' 1 (1.854 m)     Head Circumference --      Peak Flow --      Pain Score 10/15/24 1117 3     Pain Loc --      Pain Education --      Exclude from Growth Chart --     Most recent vital signs: Vitals:   10/15/24 1117 10/15/24 1430  BP: (!) 140/83 (!) 149/89  Pulse: 70 (!) 59  Resp: 18 17  Temp: 98 F (36.7 C) 98 F (36.7  C)  SpO2: 97% 100%    Physical Exam Constitutional:      Appearance: He is well-developed.  HENT:     Head: Atraumatic.  Eyes:     Conjunctiva/sclera: Conjunctivae normal.  Cardiovascular:     Rate and Rhythm: Regular rhythm.  Pulmonary:     Effort: No respiratory distress.  Musculoskeletal:     Cervical back: Normal range of motion.     Right lower leg: No edema.     Left lower leg: No edema.  Skin:    General: Skin is warm.     Capillary Refill: Capillary refill takes less than 2 seconds.  Neurological:     General: No focal deficit present.     Mental Status: He is alert. Mental status is at baseline.     IMPRESSION / MDM / ASSESSMENT AND PLAN / ED COURSE  I reviewed the triage vital signs and the nursing notes.  Differential diagnosis including pulmonary  embolism, gastritis/PUD, DVT, pneumonia   EKG  I, Clotilda Punter, the attending physician, personally viewed and interpreted this ECG.  Right bundle branch block.  Normal sinus rhythm.  T waves inverted to the septal leads which appears to be prior on present EKG in 2006.  Also had a previous right bundle branch block.  No tachycardic or bradycardic dysrhythmias while on cardiac telemetry.  RADIOLOGY Chest x-ray with no focal findings consistent with pneumonia  CTA PE -pending  LABS (all labs ordered are listed, but only abnormal results are displayed) Labs interpreted as -    Labs Reviewed  CBC - Abnormal; Notable for the following components:      Result Value   Platelets 134 (*)    All other components within normal limits  COMPREHENSIVE METABOLIC PANEL WITH GFR - Abnormal; Notable for the following components:   CO2 20 (*)    Creatinine, Ser 1.34 (*)    Calcium 8.7 (*)    GFR, Estimated 54 (*)    All other components within normal limits  TROPONIN T, HIGH SENSITIVITY - Abnormal; Notable for the following components:   Troponin T High Sensitivity 30 (*)    All other components within normal  limits  TROPONIN T, HIGH SENSITIVITY - Abnormal; Notable for the following components:   Troponin T High Sensitivity 29 (*)    All other components within normal limits     MDM  No significant leukocytosis or anemia.  Platelets mildly low at 134 but appears to be chronic and last platelets was 109 in 2017.  Creatinine appears to be at baseline.  CO2 mildly low at 20 which appears to also be chronic for the patient.  Normal LFTs.  No significant electrolyte abnormality.  Initial troponin mildly elevated at 30 and will obtain a repeat.  Chest x-ray with chronic lung disease and cystic changes and scarring to the right upper lung.  Previous masslike opacity is no longer present.  Given high risk Wells criteria given that he has a known recent DVT with an episode of chest discomfort and shortness of breath and is not currently on Eliquis  will obtain a CTA to further evaluate for pulmonary embolism.     PROCEDURES:  Critical Care performed: No  Procedures  Patient's presentation is most consistent with acute presentation with potential threat to life or bodily function.   MEDICATIONS ORDERED IN ED: Medications - No data to display  FINAL CLINICAL IMPRESSION(S) / ED DIAGNOSES   Final diagnoses:  SOB (shortness of breath)     Rx / DC Orders   ED Discharge Orders     None        Note:  This document was prepared using Dragon voice recognition software and may include unintentional dictation errors.   Punter Clotilda, MD 10/15/24 1439    Punter Clotilda, MD 10/15/24 1528  "

## 2024-10-15 NOTE — ED Notes (Signed)
 Called CCMD to add pt to monitoring.

## 2024-10-15 NOTE — ED Notes (Signed)
Full rainbow sent

## 2024-10-15 NOTE — Consult Note (Signed)
 " [facility] Vascular Consult Note  MRN : [id number]  Nathan Thomas is a 79 y.o. (1946/04/12) male who presents with chief complaint of  Chief Complaint  Patient presents with   Shortness of Breath  .  History of Present Illness: I am asked to see the patient by Dr. Dorothyann for evaluation of a pulmonary embolus with right heart strain on CT scan.  The patient has a previous history of DVT and had been on Eliquis  until recently when it was discontinued.  He then began being having pain and swelling in his legs about a week ago.  He had not yet resumed anticoagulation although that was apparently ordered and planned.  He began having chest pain and some degree of shortness of breath which prompted his emergency room visit.  As part of his workup, the ER physicians astutely ordered a CT angiogram which I have independently reviewed.  This shows a moderate clot burden with a significant amount of clot in the right main pulmonary artery and primary branches.  No significant PE was seen on the left.  The official interpretation was of right heart strain with an RV to LV ratio of just over 1.  Given this finding, we are consulted for further evaluation and treatment.  He has begun anticoagulation and remains on room air without tachycardia currently.  Current Facility-Administered Medications  Medication Dose Route Frequency Provider Last Rate Last Admin   acetaminophen  (TYLENOL ) tablet 650 mg  650 mg Oral Q6H PRN Paudel, Keshab, MD       Or   acetaminophen  (TYLENOL ) suppository 650 mg  650 mg Rectal Q6H PRN Roann Gouty, MD       alum & mag hydroxide-simeth (MAALOX/MYLANTA) 200-200-20 MG/5ML suspension 30 mL  30 mL Oral QID PRN Paudel, Keshab, MD       [START ON 10/16/2024] amLODipine  (NORVASC ) tablet 5 mg  5 mg Oral Daily Paudel, Keshab, MD       heparin  ADULT infusion 100 units/mL (25000 units/250mL)  1,600 Units/hr Intravenous Continuous Viviana Leonor BROCKS, [place] 16 mL/hr at  10/15/24 1658 1,600 Units/hr at 10/15/24 1658   magnesium  hydroxide (MILK OF MAGNESIA) suspension 30 mL  30 mL Oral Daily PRN Roann Gouty, MD       morphine  (PF) 2 MG/ML injection 2 mg  2 mg Intravenous Q4H PRN Roann Gouty, MD       ondansetron  (ZOFRAN ) tablet 4 mg  4 mg Oral Q6H PRN Paudel, Keshab, MD       Or   ondansetron  (ZOFRAN ) injection 4 mg  4 mg Intravenous Q6H PRN Paudel, Gouty, MD       oxyCODONE  (Oxy IR/ROXICODONE ) immediate release tablet 5 mg  5 mg Oral Q4H PRN Paudel, Keshab, MD       pantoprazole  (PROTONIX ) EC tablet 40 mg  40 mg Oral BID AC Paudel, Keshab, MD       polyethylene glycol (MIRALAX  / GLYCOLAX ) packet 17 g  17 g Oral Daily PRN Paudel, Gouty, MD       timolol  (TIMOPTIC ) 0.5 % ophthalmic solution 1 drop  1 drop Both Eyes BID Paudel, Keshab, MD       Current Outpatient Medications  Medication Sig Dispense Refill   acetaminophen  (TYLENOL ) 325 MG tablet Take 650 mg by mouth every 4 (four) hours as needed for fever or mild pain (pain score 1-3).     alum & mag hydroxide-simeth (MAALOX/MYLANTA) 200-200-20 MG/5ML suspension Take 30 mLs by mouth 4 (four) times  daily as needed for indigestion or heartburn.     amLODipine  (NORVASC ) 5 MG tablet Take 5 mg by mouth daily.     barrier cream (NON-SPECIFIED) CREA Apply 1 Application topically as needed (after toileting to prevent skin breakdown).     doxazosin (CARDURA) 2 MG tablet Take 2 mg by mouth daily.     guaifenesin (ROBITUSSIN) 100 MG/5ML syrup Take 15 mLs by mouth every 6 (six) hours as needed for cough.     hydrocortisone cream 1 % Apply 1 Application topically daily as needed (minor skin irritation).     loperamide (IMODIUM A-D) 2 MG tablet Take 4 mg by mouth 4 (four) times daily as needed for diarrhea or loose stools.     LUMIGAN 0.01 % SOLN Place 1 drop into both eyes at bedtime.     magnesium  hydroxide (MILK OF MAGNESIA) 400 MG/5ML suspension Take 30 mLs by mouth daily as needed for moderate constipation.      Menthol, Topical Analgesic, (GOLD BOND EXTRA STRENGTH EX) Apply 1 application  topically 2 (two) times daily as needed (irritated skin).     timolol  (TIMOPTIC ) 0.5 % ophthalmic solution Place 1 drop into both eyes 2 (two) times daily.     apixaban  (ELIQUIS ) 5 MG TABS tablet Take 10 mg (2 tablets) twice daily for 7 days.  Then take 5 mg (1 tablet) twice daily continuously. (Patient not taking: Reported on 10/15/2024) 74 tablet 0   atorvastatin (LIPITOR) 20 MG tablet Take 20 mg by mouth daily. (Patient not taking: Reported on 10/15/2024)     AZOPT 1 % ophthalmic suspension SMARTSIG:In Eye(s) (Patient not taking: Reported on 10/15/2024)     Cholecalciferol (VITAMIN D3) 50 MCG (2000 UT) capsule Take by mouth. (Patient not taking: Reported on 10/15/2024)     Cranberry 450 MG TABS Take by mouth. (Patient not taking: Reported on 10/15/2024)     doxazosin (CARDURA) 1 MG tablet Take 1 mg by mouth daily. (Patient not taking: Reported on 10/15/2024)     ferrous sulfate 325 (65 FE) MG tablet Take 325 mg by mouth 2 (two) times daily with a meal. (Patient not taking: Reported on 10/15/2024)     folic acid  (FOLVITE ) 1 MG tablet Take 1 mg by mouth daily. (Patient not taking: Reported on 10/15/2024)     magnesium  oxide (MAG-OX) 400 (241.3 Mg) MG tablet Take 1 tablet by mouth daily. (Patient not taking: Reported on 10/15/2024)     mupirocin  ointment (BACTROBAN ) 2 % Apply 1 application topically daily. Qd to excision site (Patient not taking: Reported on 10/15/2024) 22 g 1   omeprazole (PRILOSEC) 20 MG capsule Take 20 mg by mouth daily. (Patient not taking: Reported on 10/15/2024)     oxyCODONE -acetaminophen  (PERCOCET/ROXICET) 5-325 MG tablet Take by mouth every 4 (four) hours as needed for severe pain. (Patient not taking: Reported on 10/15/2024)     phosphorus (K PHOS NEUTRAL) 155-852-130 MG tablet Take by mouth 4 (four) times daily. (Patient not taking: Reported on 10/15/2024)     sertraline (ZOLOFT) 50 MG tablet Take 50 mg by  mouth daily. (Patient not taking: Reported on 10/15/2024)     sulfamethoxazole-trimethoprim (BACTRIM DS,SEPTRA DS) 800-160 MG tablet Take 2 tablets by mouth every 8 (eight) hours. (Patient not taking: Reported on 10/15/2024)     thiamine  (VITAMIN B-1) 50 MG tablet Take 50 mg by mouth daily. (Patient not taking: Reported on 10/15/2024)      Past Medical History:  Diagnosis Date   GERD (gastroesophageal reflux  disease)    Hyperlipidemia    Hypertension    Major depressive disorder     Past Surgical History:  Procedure Laterality Date   GASTRECTOMY       Social History[1] Lives in an assisted living facility   Family History History reviewed. No pertinent family history. No known history of clotting disorders or bleeding disorders  Allergies[2]   REVIEW OF SYSTEMS (Negative unless checked)  Constitutional: [] Weight loss  [] Fever  [] Chills Cardiac: [] Chest pain   [] Chest pressure   [] Palpitations   [] Shortness of breath when laying flat   [] Shortness of breath at rest   [x] Shortness of breath with exertion. Vascular:  [] Pain in legs with walking   [x] Pain in legs at rest   [] Pain in legs when laying flat   [] Claudication   [] Pain in feet when walking  [] Pain in feet at rest  [] Pain in feet when laying flat   [x] History of DVT   [x] Phlebitis   [x] Swelling in legs   [] Varicose veins   [] Non-healing ulcers Pulmonary:   [] Uses home oxygen   [] Productive cough   [] Hemoptysis   [] Wheeze  [] COPD   [] Asthma Neurologic:  [] Dizziness  [] Blackouts   [] Seizures   [] History of stroke   [] History of TIA  [] Aphasia   [] Temporary blindness   [] Dysphagia   [] Weakness or numbness in arms   [] Weakness or numbness in legs Musculoskeletal:  [] Arthritis   [] Joint swelling   [x] Joint pain   [] Low back pain Hematologic:  [] Easy bruising  [] Easy bleeding   [] Hypercoagulable state   [] Anemic  [] Hepatitis Gastrointestinal:  [] Blood in stool   [] Vomiting blood  [x] Gastroesophageal reflux/heartburn   [] Difficulty  swallowing. Genitourinary:  [] Chronic kidney disease   [] Difficult urination  [] Frequent urination  [] Burning with urination   [] Blood in urine Skin:  [] Rashes   [] Ulcers   [] Wounds Psychological:  [] History of anxiety   []  History of major depression.  Physical Examination  Vitals:   10/15/24 1117 10/15/24 1118 10/15/24 1430  BP: (!) 140/83  (!) 149/89  Pulse: 70  (!) 59  Resp: 18  17  Temp: 98 F (36.7 C)  98 F (36.7 C)  TempSrc:   Oral  SpO2: 97%  100%  Weight:  99.8 kg   Height:  6' 1 (1.854 m)    Body mass index is 29.03 kg/m. Gen:  WD/WN, NAD Head: [place]/AT, No temporalis wasting.  Ear/Nose/Throat: Hearing grossly intact, nares w/o erythema or drainage, oropharynx w/o Erythema/Exudate Eyes: Sclera non-icteric, conjunctiva clear Neck: Trachea midline.  No JVD.  Pulmonary:  Good air movement, respirations not labored, equal bilaterally.  Cardiac: RRR, normal S1, S2. Vascular:  Vessel Right Left  Radial Palpable Palpable   Musculoskeletal: M/S 5/5 throughout.  Extremities without ischemic changes.  No deformity or atrophy.  Neurologic: Sensation grossly intact in extremities.  Symmetrical.  Speech is fluent. Motor exam as listed above. Psychiatric: Judgment intact, Mood & affect appropriate for pt's clinical situation. Dermatologic: No rashes or ulcers noted.  No cellulitis or open wounds.      CBC Lab Results  Component Value Date   WBC 5.1 10/15/2024   HGB 14.7 10/15/2024   HCT 44.6 10/15/2024   MCV 96.3 10/15/2024   PLT 134 (L) 10/15/2024    BMET    Component Value Date/Time   NA 138 10/15/2024 1303   K 4.1 10/15/2024 1303   CL 105 10/15/2024 1303   CO2 20 (L) 10/15/2024 1303   GLUCOSE 88 10/15/2024 1303  BUN 16 10/15/2024 1303   CREATININE 1.34 (H) 10/15/2024 1303   CALCIUM 8.7 (L) 10/15/2024 1303   GFRNONAA 54 (L) 10/15/2024 1303   GFRAA 39 (L) 03/04/2016 1250   Estimated Creatinine Clearance: 55.6 mL/min (A) (by C-G formula based on SCr of  1.34 mg/dL (H)).  COAG Lab Results  Component Value Date   INR 1.18 03/04/2016   INR 1.20 04/02/2015    Radiology CT Angio Chest Pulmonary Embolism (PE) W or WO Contrast Result Date: 10/15/2024 EXAM: CTA of the Chest with contrast for PE 10/15/2024 04:03:05 PM TECHNIQUE: CTA of the chest was performed after the administration of 75 mL of intravenous contrast (iohexol  (OMNIPAQUE ) 350 MG/ML injection). Multiplanar reformatted images are provided for review. MIP images are provided for review. Automated exposure control, iterative reconstruction, and/or weight based adjustment of the mA/kV was utilized to reduce the radiation dose to as low as reasonably achievable. COMPARISON: Chest radiograph 10/15/2024. CLINICAL HISTORY: Pulmonary embolism (PE) suspected, high probability. Shortness of breath. Diagnosed with DVT in the left leg yesterday. FINDINGS: PULMONARY ARTERIES: Pulmonary arteries are adequately opacified for evaluation. Large filling defect demonstrated in the distal right main pulmonary artery, extending into multiple right upper lobe and lower lobe branches. This is consistent with acute pulmonary embolus. Left pulmonary arteries are patent. Clot burden is moderate. Main pulmonary artery is normal in caliber. MEDIASTINUM: The heart demonstrates an IV to LV ratio of 1.09, which may indicate right heart strain. Small pericardial effusion. The thoracic aorta is normal in caliber with calcification. Calcification of the coronary arteries. LYMPH NODES: No mediastinal, hilar or axillary lymphadenopathy. LUNGS AND PLEURA: Emphysematous changes throughout the lungs. Prominent apical and upper lobe scarring on the right. Traction bronchiectasis. Small bilateral pleural effusions. No pneumothorax. No focal consolidation or pulmonary edema. UPPER ABDOMEN: 2.2 cm diameter left adrenal gland nodule with density measurement of 1.9 Hounsfield unit suggesting benign fat-containing adenoma. No additional  follow-up is indicated. Midline ventral abdominal wall hernia containing fat. SOFT TISSUES AND BONES: Degenerative changes in the spine. No acute bony abnormalities. NOTE CRITICAL RESULT: Critical results/urgent findings were called at 4:14 PM on 10/15/2024 by radiologist W. Okey Gravely, MD to Dr. Dorothyann. IMPRESSION: 1. Acute pulmonary embolus in the distal right main pulmonary artery, extending into multiple right upper lobe and lower lobe branches, with moderate clot burden. 2. IV to LV ratio of 1.09, suggestive of right heart strain. 3. Small bilateral pleural effusions. 4. Small pericardial effusion. 5. Emphysematous changes throughout the lungs with prominent apical and upper lobe scarring on the right and traction bronchiectasis. Electronically signed by: Elsie Gravely MD 10/15/2024 04:19 PM EST RP Workstation: [id number]   DG Chest 2 View Result Date: 10/15/2024 CLINICAL DATA:  Shortness of breath. EXAM: CHEST - 2 VIEW COMPARISON:  04/02/2015 FINDINGS: The heart size and mediastinal contours are within normal limits. Stable chronic emphysematous lung disease with more prominent cystic changes and scarring in the right upper lung. Previous mass-like opacity in the right upper lung is no longer present. No edema, pneumothorax or pleural fluid identified. The visualized skeletal structures are unremarkable. IMPRESSION: Chronic emphysematous lung disease with more prominent cystic changes and scarring in the right upper lung. Previous mass-like opacity in the right upper lung is no longer present. Electronically Signed   By: Marcey Moan M.D.   On: 10/15/2024 12:30      Assessment/Plan 1.  Pulmonary embolus with right heart strain.  Clinically, the patient is reasonably stable without tachycardia or hypoxia on room  air.  He does have a significant thrombus burden.  I would describe it as moderate and I think the right heart strain is debatable on CT scan and clinically.  I discussed pulmonary  thrombectomy with the patient.  I discussed that we generally do this for submassive pulmonary embolus with large clot volume, but for moderate size clot burden if patients are particularly symptomatic this is also quite helpful.  I recommended he try to have activity tomorrow and see how he does and if he becomes markedly short of breath or tachycardic, thrombectomy could certainly be considered tomorrow.  If he tolerates this reasonably well, continued anticoagulation alone would be reasonable.  We will check back with the patient tomorrow morning. 2.  DVT.  This was the cause of the pulmonary embolus.  This was a second DVT without clear provocation and I would recommend he stay on indefinite anticoagulation. 3.  Hypertension.  Stable on outpatient medications and blood pressure control important in reducing the progression of atherosclerotic disease. On appropriate oral medications. 4.  Hyperlipidemia. lipid control important in reducing the progression of atherosclerotic disease. Continue statin therapy    Selinda Gu, MD  10/15/2024 5:16 PM    This note was created with Dragon medical transcription system.  Any error is purely unintentional    [1]  Social History Tobacco Use   Smoking status: Never  Vaping Use   Vaping status: Never Used  Substance Use Topics   Alcohol use: No   Drug use: Never  [2] No Known Allergies  "

## 2024-10-15 NOTE — Consult Note (Signed)
 PHARMACY - ANTICOAGULATION CONSULT NOTE  Pharmacy Consult for heparin  dosing Indication: new PE  Allergies[1]  Patient Measurements: Height: 6' 1 (185.4 cm) Weight: 99.8 kg (220 lb) IBW/kg (Calculated) : 79.9 HEPARIN  DW (KG): 99.8  Vital Signs: Temp: 98 F (36.7 C) (01/29 1430) Temp Source: Oral (01/29 1430) BP: 149/89 (01/29 1430) Pulse Rate: 59 (01/29 1430)  Labs: Recent Labs    10/15/24 1303  HGB 14.7  HCT 44.6  PLT 134*  CREATININE 1.34*    Estimated Creatinine Clearance: 55.6 mL/min (A) (by C-G formula based on SCr of 1.34 mg/dL (H)).   Medical History: Past Medical History:  Diagnosis Date   GERD (gastroesophageal reflux disease)    Hyperlipidemia    Hypertension    Major depressive disorder     Medications:  Apixaban  5mg  BID - last dose 09/28/2024  Assessment: Nathan Thomas is a 14 yoM presenting with concerns for new pulmonary embolism. Past medical history is notable for history of DVT (2017, was put on apixaban  until 09/28/2024), HTN, and HLD.  Prior to ED arrival, patient was diagnosed with DVT in left leg yesterday (1/28). Patient had just been taken off apixaban  on 1/12, but was scheduled to resume apixaban  following DVT diagnosis on 1/28. Patient reports last dose of apixaban  on 1/12, as has not picked up new apixaban  prescription yet.  On arrival to ED, patient with complaints of shortness of breath, on room air. CT-PE demonstrating acute pulmonary embolus in the distal right main pulmonary artery, extending into multiple right upper lobe and lower lobe branches, with moderate clot burden. ECHO has been ordered. Vascular surgery planning for possible thrombectomy tomorrow (1/30) morning.  Baseline hgb 14.7, plt 134 (no recent baseline available).  Goal of Therapy:  Heparin  level 0.3-0.7 units/ml Monitor platelets by anticoagulation protocol: Yes   Plan:  Confirmed with patient that last apixaban  dose taken approximately two weeks ago. Will  bolus. Give 6000 units bolus x 1, and start heparin  infusion at 1600 units/hr Check anti-Xa level in 8 hours and daily while on heparin  Continue to monitor H&H and platelets  Leonor JAYSON Argyle, PharmD Pharmacy Resident  10/15/2024 4:53 PM       [1] No Known Allergies

## 2024-10-15 NOTE — ED Notes (Signed)
 Pt transported to room 38 via ED stretcher, accompanied by this RN, receiving RN made aware of pts arrival.  Nurse sign off.

## 2024-10-15 NOTE — H&P (Signed)
 "  History and Physical    Nathan Thomas FMW:[id number] DOB: [birth year] DOA: 10/15/2024  DOS: the patient was seen and examined on 10/15/2024  PCP: Patient, No Pcp Per   Patient coming from: SNF  I have personally briefly reviewed patient's old medical records in [facility]  Chief Complaint: Shortness of breath since yesterday  HPI: Nathan Thomas is a pleasant 79 y.o. 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  Patient was diagnosed on 01/28 with a DVT LLE.  He was prescribed Eliquis  but  yet to get it from the pharmacy.  While waiting for anticoagulation, patient developed shortness of breath and was transferred to hospital for evaluation.  Patient denies any fever, chills, nausea vomiting diarrhea abdominal pain, chest pain, leg swelling,, headache.  ED Course: Upon arrival to the ED, patient is found to acute pulmonary embolism on right pulmonary artery with possible right heart strain pattern.  Vascular surgery was consulted by ED provider and they advised that patient will be n.p.o. past midnight for possible procedure.  Patient was started on heparin  drip.  Hospitalist service was consulted for evaluation for admission.  Review of Systems:  ROS  All other systems negative except as noted in the HPI.  Past Medical History:  Diagnosis Date   GERD (gastroesophageal reflux disease)    Hyperlipidemia    Hypertension    Major depressive disorder     Past Surgical History:  Procedure Laterality Date   GASTRECTOMY       reports that he has never smoked. He does not have any smokeless tobacco history on file. He reports that he does not drink alcohol and does not use drugs.  Allergies[1]  History reviewed. No pertinent family history.  Prior to Admission medications  Medication Sig Start Date  End Date Taking? Authorizing Provider  acetaminophen  (TYLENOL ) 325 MG tablet Take 650 mg by mouth every 4 (four) hours as needed for fever or mild pain (pain score 1-3).   Yes [provider]  alum & mag hydroxide-simeth (MAALOX/MYLANTA) 200-200-20 MG/5ML suspension Take 30 mLs by mouth 4 (four) times daily as needed for indigestion or heartburn.   Yes [provider]  amLODipine  (NORVASC ) 5 MG tablet Take 5 mg by mouth daily. 10/22/20  Yes [provider]  barrier cream (NON-SPECIFIED) CREA Apply 1 Application topically as needed (after toileting to prevent skin breakdown).   Yes [provider]  doxazosin (CARDURA) 2 MG tablet Take 2 mg by mouth daily. 06/09/15  Yes [provider]  guaifenesin (ROBITUSSIN) 100 MG/5ML syrup Take 15 mLs by mouth every 6 (six) hours as needed for cough.   Yes [provider]  hydrocortisone cream 1 % Apply 1 Application topically daily as needed (minor skin irritation).   Yes [provider]  loperamide (IMODIUM A-D) 2 MG tablet Take 4 mg by mouth 4 (four) times daily as needed for diarrhea or loose stools.   Yes [provider]  LUMIGAN 0.01 % SOLN Place 1 drop into both eyes at bedtime. 06/22/24  Yes [provider]  magnesium  hydroxide (MILK OF MAGNESIA) 400 MG/5ML suspension Take 30 mLs by mouth daily as needed for moderate constipation.   Yes [provider]  Menthol, Topical Analgesic, (GOLD BOND EXTRA STRENGTH EX) Apply 1 application  topically 2 (two) times  daily as needed (irritated skin).   Yes [provider]  timolol  (TIMOPTIC ) 0.5 % ophthalmic solution Place 1 drop into both eyes 2 (two) times daily. 09/17/24  Yes [provider]  apixaban  (ELIQUIS ) 5 MG TABS tablet Take 10 mg (2 tablets) twice daily for 7 days.  Then take 5 mg (1 tablet) twice daily continuously. Patient not taking: Reported on 10/15/2024 03/04/16   Gordan Huxley, MD  atorvastatin  (LIPITOR) 20 MG tablet Take 20 mg by mouth daily. Patient not taking: Reported on 10/15/2024    [provider]  AZOPT 1 % ophthalmic suspension SMARTSIG:In Eye(s) Patient not taking: Reported on 10/15/2024 09/23/20   [provider]  Cholecalciferol (VITAMIN D3) 50 MCG (2000 UT) capsule Take by mouth. Patient not taking: Reported on 10/15/2024 10/22/20   [provider]  Cranberry 450 MG TABS Take by mouth. Patient not taking: Reported on 10/15/2024 10/22/20   [provider]  doxazosin (CARDURA) 1 MG tablet Take 1 mg by mouth daily. Patient not taking: Reported on 10/15/2024    [provider]  ferrous sulfate 325 (65 FE) MG tablet Take 325 mg by mouth 2 (two) times daily with a meal. Patient not taking: Reported on 10/15/2024    [provider]  folic acid  (FOLVITE ) 1 MG tablet Take 1 mg by mouth daily. Patient not taking: Reported on 10/15/2024    [provider]  magnesium  oxide (MAG-OX) 400 (241.3 Mg) MG tablet Take 1 tablet by mouth daily. Patient not taking: Reported on 10/15/2024 10/22/20   [provider]  mupirocin  ointment (BACTROBAN ) 2 % Apply 1 application topically daily. Qd to excision site Patient not taking: Reported on 10/15/2024 07/18/21   Hester Alm BROCKS, MD  omeprazole (PRILOSEC) 20 MG capsule Take 20 mg by mouth daily. Patient not taking: Reported on 10/15/2024    [provider]  oxyCODONE -acetaminophen  (PERCOCET/ROXICET) 5-325 MG tablet Take by mouth every 4 (four) hours as needed for severe pain. Patient not taking: Reported on 10/15/2024    [provider]  phosphorus (K PHOS NEUTRAL) 155-852-130 MG tablet Take by mouth 4 (four) times daily. Patient not taking: Reported on 10/15/2024    [provider]  sertraline (ZOLOFT) 50 MG tablet Take 50 mg by mouth daily. Patient not taking: Reported on 10/15/2024    [provider]  sulfamethoxazole-trimethoprim (BACTRIM DS,SEPTRA DS)  800-160 MG tablet Take 2 tablets by mouth every 8 (eight) hours. Patient not taking: Reported on 10/15/2024    [provider]  thiamine  (VITAMIN B-1) 50 MG tablet Take 50 mg by mouth daily. Patient not taking: Reported on 10/15/2024    [provider]    Physical Exam: Vitals:   10/15/24 1117 10/15/24 1118 10/15/24 1430  BP: (!) 140/83  (!) 149/89  Pulse: 70  (!) 59  Resp: 18  17  Temp: 98 F (36.7 C)  98 F (36.7 C)  TempSrc:   Oral  SpO2: 97%  100%  Weight:  99.8 kg   Height:  6' 1 (1.854 m)     Physical Exam   Constitutional: Alert, awake, calm, comfortable HEENT: Neck supple Respiratory: Clear to auscultation B/L, no wheezing, no rales.  Cardiovascular: Regular rate and rhythm, no murmurs / rubs / gallops. No extremity edema. 2+ pedal pulses. No carotid bruits.  Abdomen: Soft, no tenderness, Bowel sounds positive.  Musculoskeletal: no clubbing / cyanosis. Good ROM, no contractures. Normal muscle tone.  Skin: no rashes, lesions, ulcers. Neurologic: CN 2-12  grossly intact. Sensation intact, No focal deficit identified Psychiatric: Alert and oriented x 3. Normal mood.    Labs on Admission: I have personally reviewed following labs and imaging studies  CBC: Recent Labs  Lab 10/15/24 1303  WBC 5.1  HGB 14.7  HCT 44.6  MCV 96.3  PLT 134*   Basic Metabolic Panel: Recent Labs  Lab 10/15/24 1303  NA 138  K 4.1  CL 105  CO2 20*  GLUCOSE 88  BUN 16  CREATININE 1.34*  CALCIUM 8.7*   GFR: Estimated Creatinine Clearance: 55.6 mL/min (A) (by C-G formula based on SCr of 1.34 mg/dL (H)). Liver Function Tests: Recent Labs  Lab 10/15/24 1303  AST 22  ALT 12  ALKPHOS 86  BILITOT 0.4  PROT 6.7  ALBUMIN 4.1   No results for input(s): LIPASE, AMYLASE in the last 168 hours. No results for input(s): AMMONIA in the last 168 hours. Coagulation Profile: No results for input(s): INR, PROTIME in the last 168 hours. Cardiac Enzymes: No  results for input(s): CKTOTAL, CKMB, CKMBINDEX, TROPONINI, TROPONINIHS in the last 168 hours. BNP (last 3 results) No results for input(s): BNP in the last 8760 hours. HbA1C: No results for input(s): HGBA1C in the last 72 hours. CBG: No results for input(s): GLUCAP in the last 168 hours. Lipid Profile: No results for input(s): CHOL, HDL, LDLCALC, TRIG, CHOLHDL, LDLDIRECT in the last 72 hours. Thyroid Function Tests: No results for input(s): TSH, T4TOTAL, FREET4, T3FREE, THYROIDAB in the last 72 hours. Anemia Panel: No results for input(s): VITAMINB12, FOLATE, FERRITIN, TIBC, IRON, RETICCTPCT in the last 72 hours. Urine analysis:    Component Value Date/Time   COLORURINE AMBER (A) 04/03/2015 2219   APPEARANCEUR TURBID (A) 04/03/2015 2219   LABSPEC 1.023 04/03/2015 2219   PHURINE 6.0 04/03/2015 2219   GLUCOSEU 50 (A) 04/03/2015 2219   HGBUR 2+ (A) 04/03/2015 2219   BILIRUBINUR NEGATIVE 04/03/2015 2219   KETONESUR NEGATIVE 04/03/2015 2219   PROTEINUR 100 (A) 04/03/2015 2219   NITRITE NEGATIVE 04/03/2015 2219   LEUKOCYTESUR NEGATIVE 04/03/2015 2219    Radiological Exams on Admission: I have personally reviewed images CT Angio Chest Pulmonary Embolism (PE) W or WO Contrast Result Date: 10/15/2024 EXAM: CTA of the Chest with contrast for PE 10/15/2024 04:03:05 PM TECHNIQUE: CTA of the chest was performed after the administration of 75 mL of intravenous contrast (iohexol  (OMNIPAQUE ) 350 MG/ML injection). Multiplanar reformatted images are provided for review. MIP images are provided for review. Automated exposure control, iterative reconstruction, and/or weight based adjustment of the mA/kV was utilized to reduce the radiation dose to as low as reasonably achievable. COMPARISON: Chest radiograph 10/15/2024. CLINICAL HISTORY: Pulmonary embolism (PE) suspected, high probability. Shortness of breath. Diagnosed with DVT in the left leg yesterday.  FINDINGS: PULMONARY ARTERIES: Pulmonary arteries are adequately opacified for evaluation. Large filling defect demonstrated in the distal right main pulmonary artery, extending into multiple right upper lobe and lower lobe branches. This is consistent with acute pulmonary embolus. Left pulmonary arteries are patent. Clot burden is moderate. Main pulmonary artery is normal in caliber. MEDIASTINUM: The heart demonstrates an IV to LV ratio of 1.09, which may indicate right heart strain. Small pericardial effusion. The thoracic aorta is normal in caliber with calcification. Calcification of the coronary arteries. LYMPH NODES: No mediastinal, hilar or axillary lymphadenopathy. LUNGS AND PLEURA: Emphysematous changes throughout the lungs. Prominent apical and upper lobe scarring on the right. Traction bronchiectasis. Small bilateral pleural effusions. No pneumothorax. No focal consolidation or pulmonary  edema. UPPER ABDOMEN: 2.2 cm diameter left adrenal gland nodule with density measurement of 1.9 Hounsfield unit suggesting benign fat-containing adenoma. No additional follow-up is indicated. Midline ventral abdominal wall hernia containing fat. SOFT TISSUES AND BONES: Degenerative changes in the spine. No acute bony abnormalities. NOTE CRITICAL RESULT: Critical results/urgent findings were called at 4:14 PM on 10/15/2024 by radiologist W. Okey Gravely, MD to Dr. Dorothyann. IMPRESSION: 1. Acute pulmonary embolus in the distal right main pulmonary artery, extending into multiple right upper lobe and lower lobe branches, with moderate clot burden. 2. IV to LV ratio of 1.09, suggestive of right heart strain. 3. Small bilateral pleural effusions. 4. Small pericardial effusion. 5. Emphysematous changes throughout the lungs with prominent apical and upper lobe scarring on the right and traction bronchiectasis. Electronically signed by: Elsie Gravely MD 10/15/2024 04:19 PM EST RP Workstation: [id number]   DG Chest 2  View Result Date: 10/15/2024 CLINICAL DATA:  Shortness of breath. EXAM: CHEST - 2 VIEW COMPARISON:  04/02/2015 FINDINGS: The heart size and mediastinal contours are within normal limits. Stable chronic emphysematous lung disease with more prominent cystic changes and scarring in the right upper lung. Previous mass-like opacity in the right upper lung is no longer present. No edema, pneumothorax or pleural fluid identified. The visualized skeletal structures are unremarkable. IMPRESSION: Chronic emphysematous lung disease with more prominent cystic changes and scarring in the right upper lung. Previous mass-like opacity in the right upper lung is no longer present. Electronically Signed   By: Marcey Moan M.D.   On: 10/15/2024 12:30    EKG: My personal interpretation of EKG shows: NSR     Assessment/Plan Principal Problem:   Pulmonary embolism (HCC) Active Problems:   HTN (hypertension)   HLD (hyperlipidemia)   GERD (gastroesophageal reflux disease)   Depression    Assessment and Plan: This is a 79 year old male nursing home resident at [facility] house with history of HTN, HLD, GERD, depression and history of prior DVT in 2018 not on any anticoagulation at this point came into ED for shortness of breath.  He was diagnosed with DVT LLE yesterday.  1.  DVT LLE/PE right main pulmonary artery - There is a concern for right-sided strain pattern - Will be admitted to hospital as inpatient - Started on heparin  drip - Vascular surgery has been contacted for possible thrombectomy - Echocardiogram has been ordered - Vascular surgery is planning on possibly doing thrombectomy in AM. - Patient will be n.p.o. past midnight  2.  HTN - Resume his home amlodipine   3.  GERD - Protonix  and Maalox  4.  HLD - I have not seen any medication on his list. - Resume his home medication if any  5.  Depression - It appears that patient has not been on anything as he was taking Zoloft but it says he  was not taking at this point - He is a stable    DVT prophylaxis: IV heparin  gtts Code Status: Full Code Family Communication: None Disposition Plan: Back to [facility] nursing home Consults called: Vascular surgery by EDP Admission status: Inpatient, progressive   Nena Rebel, MD [facility] 10/15/2024, 4:56 PM        [1] No Known Allergies  "

## 2024-10-15 NOTE — ED Provider Notes (Signed)
----------------------------------------- °  4:22 PM on 10/15/2024 ----------------------------------------- Patient care assumed from Dr. Suzanne.  Patient's CTA has resulted showing a moderate PE burden mostly affecting the right pulmonary artery.  Some signs of right heart strain.  Patient is satting 99 to 100% on room air with a normal pulse rate.  Will start the patient on heparin  and admit to the hospitalist service for further workup and treatment.  Patient's heart enzyme remains slightly elevated on recheck but not significantly changed.  Discussed the findings with the patient he is agreeable to admission.  CRITICAL CARE Performed by: Franky Moores   Total critical care time: 30 minutes  Critical care time was exclusive of separately billable procedures and treating other patients.  Critical care was necessary to treat or prevent imminent or life-threatening deterioration.  Critical care was time spent personally by me on the following activities: development of treatment plan with patient and/or surrogate as well as nursing, discussions with consultants, evaluation of patient's response to treatment, examination of patient, obtaining history from patient or surrogate, ordering and performing treatments and interventions, ordering and review of laboratory studies, ordering and review of radiographic studies, pulse oximetry and re-evaluation of patient's condition.    Moores Franky, MD 10/15/24 1623

## 2024-10-15 NOTE — ED Triage Notes (Signed)
 Pt comes with c/o 1 episode of sob. Pt was dx with DVT in left leg yesterday. Pt was on thinner until the 12th and taken off. Pt has not been placed back on it. Pt stats no longer having sob. Pt states he might have ate too many cookies. Pt states he just felt full.

## 2024-10-15 NOTE — ED Notes (Signed)
 Report given to Maegan, RN in C Pod who will be assuming care of pt upon his transfer to room 38.

## 2024-10-16 DIAGNOSIS — I2699 Other pulmonary embolism without acute cor pulmonale: Secondary | ICD-10-CM | POA: Diagnosis not present

## 2024-10-16 DIAGNOSIS — Z8719 Personal history of other diseases of the digestive system: Secondary | ICD-10-CM

## 2024-10-16 DIAGNOSIS — Z86718 Personal history of other venous thrombosis and embolism: Secondary | ICD-10-CM

## 2024-10-16 LAB — CBC
HCT: 41.1 % (ref 39.0–52.0)
Hemoglobin: 13.9 g/dL (ref 13.0–17.0)
MCH: 31.5 pg (ref 26.0–34.0)
MCHC: 33.8 g/dL (ref 30.0–36.0)
MCV: 93.2 fL (ref 80.0–100.0)
Platelets: 125 10*3/uL — ABNORMAL LOW (ref 150–400)
RBC: 4.41 MIL/uL (ref 4.22–5.81)
RDW: 12.6 % (ref 11.5–15.5)
WBC: 4.7 10*3/uL (ref 4.0–10.5)
nRBC: 0 % (ref 0.0–0.2)

## 2024-10-16 LAB — COMPREHENSIVE METABOLIC PANEL WITH GFR
ALT: 10 U/L (ref 0–44)
AST: 20 U/L (ref 15–41)
Albumin: 3.7 g/dL (ref 3.5–5.0)
Alkaline Phosphatase: 79 U/L (ref 38–126)
Anion gap: 9 (ref 5–15)
BUN: 13 mg/dL (ref 8–23)
CO2: 24 mmol/L (ref 22–32)
Calcium: 8.5 mg/dL — ABNORMAL LOW (ref 8.9–10.3)
Chloride: 104 mmol/L (ref 98–111)
Creatinine, Ser: 1.17 mg/dL (ref 0.61–1.24)
GFR, Estimated: >60 mL/min
Glucose, Bld: 94 mg/dL (ref 70–99)
Potassium: 3.8 mmol/L (ref 3.5–5.1)
Sodium: 136 mmol/L (ref 135–145)
Total Bilirubin: 0.4 mg/dL (ref 0.0–1.2)
Total Protein: 6.3 g/dL — ABNORMAL LOW (ref 6.5–8.1)

## 2024-10-16 LAB — HEPARIN LEVEL (UNFRACTIONATED): Heparin Unfractionated: >1.1 [IU]/mL — ABNORMAL HIGH (ref 0.30–0.70)

## 2024-10-16 MED ORDER — APIXABAN 5 MG PO TABS: 5.0000 mg | ORAL_TABLET | Freq: Two times a day (BID) | ORAL | Status: DC

## 2024-10-16 MED ORDER — APIXABAN 5 MG PO TABS: ORAL_TABLET | ORAL | 1 refills | Status: DC

## 2024-10-16 MED ORDER — APIXABAN 5 MG PO TABS: ORAL_TABLET | ORAL | 1 refills | Status: AC

## 2024-10-16 MED ORDER — HEPARIN (PORCINE) 25000 UT/250ML-% IV SOLN
1250.0000 [IU]/h | INTRAVENOUS | Status: DC
  Administered 2024-10-16 (×2): 1250 [IU]/h via INTRAVENOUS
  Filled 2024-10-16: qty 250

## 2024-10-16 MED ORDER — APIXABAN 5 MG PO TABS
10.0000 mg | ORAL_TABLET | Freq: Two times a day (BID) | ORAL | Status: DC
  Administered 2024-10-16: 10 mg via ORAL
  Filled 2024-10-16: qty 2

## 2024-10-16 NOTE — Progress Notes (Signed)
 Rn o2 eval today as follows:  Room air Spo2 at rest - 100% Ambulating on room air - 80% Ambulating on 2L [place] - 92% Ambulating on 3L [place] - 96%

## 2024-10-16 NOTE — TOC Transition Note (Signed)
 Transition of Care [facility]) - Discharge Note   Patient Details  Name: Aiman Noe MRN: [id number] Date of Birth: 1946-08-22  [facility] CM/SW Contact:  [name], RN Phone Number: 10/16/2024, 3:34 PM   Clinical Narrative:    RNCM met with patient at the bedside, introduced myself and my role and explained that discharge planning would be discussed. MD is recommending oxygen on ambulation and patient was in agreement for the oxygen. Oxygen ordered via [employer] to be delivered to bedside. Patient is from [facility] [address] and is returning there. Patient states he has  a RW. RNCM spoke to [facility] @ [phone] and she said that the patient could return as long as the concentrator was delivered by the DME company prior to his arrival. Monda sates that [facility] form her group home would come pick up the patient at the time of discharge as long as it was before 3pm. MD and bedside nurse made aware. RNCM confirmed that the oxygen was delivered to bedside. Pt has discharge orders, no further concerns. RNCM signing off.   Final next level of care: Group Home Barriers to Discharge: Barriers Resolved   Patient Goals and CMS Choice            Discharge Placement                       Discharge Plan and Services Additional resources added to the After Visit Summary for                  DME Arranged: Oxygen DME Agency: [employer] Date DME Agency Contacted: 10/16/24   Representative spoke with at DME Agency: Thomasina            Social Drivers of Health (SDOH) Interventions SDOH Screenings   Tobacco Use: Unknown (10/15/2024)     Readmission Risk Interventions     No data to display

## 2024-10-16 NOTE — Progress Notes (Signed)
 [facility]  Daily Progress Note   Subjective  -   Resting comfortably.  Notes that he recently walked and didn't experience any shortness of breath or discomfort.  Notes that he didn't have any significant SOB prior to his visit to [facility].  Objective Vitals:   10/16/24 0430 10/16/24 0500 10/16/24 0530 10/16/24 0600  BP: (!) 155/65 (!) 150/67 (!) 150/71 (!) 143/78  Pulse: (!) 32 (!) 31 (!) 31 84  Resp: (!) 25 20 18 14   Temp:      TempSrc:      SpO2: 99% 99% 100% 99%  Weight:      Height:        Intake/Output Summary (Last 24 hours) at 10/16/2024 0948 Last data filed at 10/16/2024 0451 Gross per 24 hour  Intake 232.32 ml  Output 500 ml  Net -267.68 ml    PULM  CTAB CV  RRR VASC  No significant Edema bilaterally   Laboratory CBC    Component Value Date/Time   WBC 4.7 10/16/2024 0404   HGB 13.9 10/16/2024 0404   HCT 41.1 10/16/2024 0404   PLT 125 (L) 10/16/2024 0404    BMET    Component Value Date/Time   NA 136 10/16/2024 0404   K 3.8 10/16/2024 0404   CL 104 10/16/2024 0404   CO2 24 10/16/2024 0404   GLUCOSE 94 10/16/2024 0404   BUN 13 10/16/2024 0404   CREATININE 1.17 10/16/2024 0404   CALCIUM 8.5 (L) 10/16/2024 0404   GFRNONAA >60 10/16/2024 0404   GFRAA 39 (L) 03/04/2016 1250    Assessment/Planning: Pulmonary Embolism  -Recommend anticoagulation as patient is not significantly symptomatic.     Deep Vein Thrombosis  -Currently no swelling but some discomfort. -Given recurrent DVT following being off Eliquis , recommend lifelong anticoagulation -Will follow up in 4-5 weeks to evaluate DVT outpatient -Recommend elevation to prevent some post phlebetic swelling and use of compression if necessary.    Nathan Thomas  10/16/2024, 9:48 AM

## 2024-10-16 NOTE — Care Management Important Message (Signed)
 Important Message  Patient Details  Name: Nakoa Ganus MRN: [id number] Date of Birth: 02/24/1946   Important Message Given:  Yes      [name], RN 10/16/2024, 3:18 PM

## 2024-10-16 NOTE — Discharge Instructions (Addendum)
 Information on my medicine - ELIQUIS  (apixaban )  This medication education was reviewed with me or my healthcare representative as part of my discharge preparation.    Why was Eliquis  prescribed for you? Eliquis  was prescribed to treat blood clots that may have been found in the veins of your legs (deep vein thrombosis) or in your lungs (pulmonary embolism) and to reduce the risk of them occurring again.  What do You need to know about Eliquis  ? The starting dose is 10 mg (two 5 mg tablets) taken TWICE daily for the FIRST SEVEN (7) DAYS, then on 10/24/2023 the dose is reduced to ONE 5 mg tablet taken TWICE daily.  Eliquis  may be taken with or without food.   Try to take the dose about the same time in the morning and in the evening. If you have difficulty swallowing the tablet whole please discuss with your pharmacist how to take the medication safely.  Take Eliquis  exactly as prescribed and DO NOT stop taking Eliquis  without talking to the doctor who prescribed the medication.  Stopping may increase your risk of developing a new blood clot.  Refill your prescription before you run out.  After discharge, you should have regular check-up appointments with your healthcare provider that is prescribing your Eliquis .    What do you do if you miss a dose? If a dose of ELIQUIS  is not taken at the scheduled time, take it as soon as possible on the same day and twice-daily administration should be resumed. The dose should not be doubled to make up for a missed dose.  Important Safety Information A possible side effect of Eliquis  is bleeding. You should call your healthcare provider right away if you experience any of the following: Bleeding from an injury or your nose that does not stop. Unusual colored urine (red or dark brown) or unusual colored stools (red or black). Unusual bruising for unknown reasons. A serious fall or if you hit your head (even if there is no bleeding).  Some  medicines may interact with Eliquis  and might increase your risk of bleeding or clotting while on Eliquis . To help avoid this, consult your healthcare provider or pharmacist prior to using any new prescription or non-prescription medications, including herbals, vitamins, non-steroidal anti-inflammatory drugs (NSAIDs) and supplements.  This website has more information on Eliquis  (apixaban ): http://[email or web address] .com/eliquis dena

## 2024-10-16 NOTE — Progress Notes (Signed)
 Room air Spo2 at rest - 100% Ambulating on room air - 80% Ambulating on 2L [place] - 92% Ambulating on 3L [place] - 96%

## 2024-10-16 NOTE — Consult Note (Signed)
 PHARMACY - ANTICOAGULATION CONSULT NOTE  Pharmacy Consult for heparin  dosing Indication: new PE  Allergies[1]  Patient Measurements: Height: 6' 1 (185.4 cm) Weight: 99.8 kg (220 lb) IBW/kg (Calculated) : 79.9 HEPARIN  DW (KG): 99.8  Vital Signs: Temp: 97.8 F (36.6 C) (01/30 0052) Temp Source: Oral (01/29 2014) BP: 161/61 (01/30 0300) Pulse Rate: 87 (01/30 0300)  Labs: Recent Labs    10/15/24 1303 10/15/24 1642 10/16/24 0310  HGB 14.7  --   --   HCT 44.6  --   --   PLT 134*  --   --   APTT  --  <22*  --   LABPROT  --  16.3*  --   INR  --  1.2  --   HEPARINUNFRC  --  <0.10* >1.10*  CREATININE 1.34*  --   --     Estimated Creatinine Clearance: 55.6 mL/min (A) (by C-G formula based on SCr of 1.34 mg/dL (H)).   Medical History: Past Medical History:  Diagnosis Date   GERD (gastroesophageal reflux disease)    Hyperlipidemia    Hypertension    Major depressive disorder     Medications:  Apixaban  5mg  BID - last dose 09/28/2024  Assessment: Nathan Thomas is a 46 yoM presenting with concerns for new pulmonary embolism. Past medical history is notable for history of DVT (2017, was put on apixaban  until 09/28/2024), HTN, and HLD.  Prior to ED arrival, patient was diagnosed with DVT in left leg yesterday (1/28). Patient had just been taken off apixaban  on 1/12, but was scheduled to resume apixaban  following DVT diagnosis on 1/28. Patient reports last dose of apixaban  on 1/12, as has not picked up new apixaban  prescription yet.  On arrival to ED, patient with complaints of shortness of breath, on room air. CT-PE demonstrating acute pulmonary embolus in the distal right main pulmonary artery, extending into multiple right upper lobe and lower lobe branches, with moderate clot burden. ECHO has been ordered. Vascular surgery planning for possible thrombectomy tomorrow (1/30) morning.  Baseline hgb 14.7, plt 134 (no recent baseline available).  Goal of Therapy:  Heparin  level  0.3-0.7 units/ml Monitor platelets by anticoagulation protocol: Yes   Plan:  1/30:  HL @ 0310 = > 1.10, elevated - RN confirmed sample was drawn from opposite arm as heparin  infusion so will take this as valid - hold heparin  drip for 1 hr and restart @ 1250 units/hr - recheck HL 8 hrs after restart  Continue to monitor H&H and platelets  Colbey Wirtanen D, PharmD 10/16/2024 3:48 AM        [1] No Known Allergies

## 2024-10-16 NOTE — Care Management CC44 (Signed)
"         Condition Code 44 Documentation Completed  Patient Details  Name: Nathan Thomas MRN: [id number] Date of Birth: 1946-03-09   Condition Code 44 given:  Yes  Patient signature on Condition Code 44 notice:  Yes  Documentation of 2 MD's agreement:  Yes  Code 44 added to claim:  Yes     [name], RN 10/16/2024, 3:18 PM  "

## 2024-10-16 NOTE — Discharge Summary (Signed)
 Nathan Thomas FMW:[id number] DOB: 1946/01/10 DOA: 10/15/2024  PCP: Patient, No Pcp Per  Admit date: 10/15/2024 Discharge date: 10/16/2024  Time spent: 35 minutes  Recommendations for Outpatient Follow-up:  Vascular surgery f/u 1 month  Pcp f/u, monitor oxygen, should hopefully be able to wean off oxygen in the coming days (currently only requiring with ambulation)    Discharge Diagnoses:  Principal Problem:   Pulmonary embolism (HCC) Active Problems:   HTN (hypertension)   HLD (hyperlipidemia)   GERD (gastroesophageal reflux disease)   Depression   History of duodenal ulcer   History of DVT (deep vein thrombosis)   Discharge Condition: stable  Diet recommendation: heart healthy  Filed Weights   10/15/24 1118  Weight: 99.8 kg    History of present illness:  Nathan Thomas is a pleasant 79 y.o. male with medical history significant for history of DVT in 2018 not on anticoagulation, HTN, HLD, GERD, depression who is a resident at [facility] nursing home brought in for worsening shortness of breath for 1 day duration.  Patient was diagnosed on 01/28 with a DVT LLE.  He was prescribed Eliquis  but  yet to get it from the pharmacy.  While waiting for anticoagulation, patient developed shortness of breath and was transferred to hospital for evaluation.  Patient denies any fever, chills, nausea vomiting diarrhea abdominal pain, chest pain, leg swelling,, headache.    Hospital Course:  History DVT, reports he thinks he was recently taken off apixaban , reports u/s at his facility a couple of days ago revealed left leg DVT, presenting with SOB, found to have acute PE distal main right pulmonary artery. Signs RH strain on CT but per vascular read that's unlikely and clinically no signs right heart failure is feeling well. Satting 100% on room air but does desat to low 80s with ambulation (though no dyspnea with ambulation, reports no trouble ambulating). Will discharge with ambulatory O2 and  have resumed apixaban , advise staying on that permanently. Vascular surgery advises f/u with them in about one month.   Procedures: none   Consultations: Vascular surgery  Discharge Exam: Vitals:   10/16/24 1015 10/16/24 1321  BP: (!) 156/72 (!) 157/71  Pulse: 90 (!) 37  Resp: 20 16  Temp: 98.9 F (37.2 C) 98.3 F (36.8 C)  SpO2: 100% 100%    General: NAD Cardiovascular: RRR Respiratory: CTAB Ext: mild LLE swelling  Discharge Instructions   Discharge Instructions     Diet general   Complete by: As directed    Increase activity slowly   Complete by: As directed       Allergies as of 10/16/2024   No Known Allergies      Medication List     STOP taking these medications    oxyCODONE -acetaminophen  5-325 MG tablet Commonly known as: PERCOCET/ROXICET   sulfamethoxazole-trimethoprim 800-160 MG tablet Commonly known as: BACTRIM DS       TAKE these medications    acetaminophen  325 MG tablet Commonly known as: TYLENOL  Take 650 mg by mouth every 4 (four) hours as needed for fever or mild pain (pain score 1-3).   alum & mag hydroxide-simeth 200-200-20 MG/5ML suspension Commonly known as: MAALOX/MYLANTA Take 30 mLs by mouth 4 (four) times daily as needed for indigestion or heartburn.   amLODipine  5 MG tablet Commonly known as: NORVASC  Take 5 mg by mouth daily.   apixaban  5 MG Tabs tablet Commonly known as: ELIQUIS  Take 10 mg twice a day for 7 days, then take 5 mg twice a  day indefinitely What changed: additional instructions   atorvastatin 20 MG tablet Commonly known as: LIPITOR Take 20 mg by mouth daily.   Azopt 1 % ophthalmic suspension Generic drug: brinzolamide SMARTSIG:In Eye(s)   barrier cream Crea Commonly known as: non-specified Apply 1 Application topically as needed (after toileting to prevent skin breakdown).   Cranberry 450 MG Tabs Take by mouth.   doxazosin 2 MG tablet Commonly known as: CARDURA Take 2 mg by mouth daily. What  changed: Another medication with the same name was removed. Continue taking this medication, and follow the directions you see here.   ferrous sulfate 325 (65 FE) MG tablet Take 325 mg by mouth 2 (two) times daily with a meal.   folic acid  1 MG tablet Commonly known as: FOLVITE  Take 1 mg by mouth daily.   GOLD BOND EXTRA STRENGTH EX Apply 1 application  topically 2 (two) times daily as needed (irritated skin).   guaifenesin 100 MG/5ML syrup Commonly known as: ROBITUSSIN Take 15 mLs by mouth every 6 (six) hours as needed for cough.   hydrocortisone cream 1 % Apply 1 Application topically daily as needed (minor skin irritation).   loperamide 2 MG tablet Commonly known as: IMODIUM A-D Take 4 mg by mouth 4 (four) times daily as needed for diarrhea or loose stools.   Lumigan 0.01 % Soln Generic drug: bimatoprost Place 1 drop into both eyes at bedtime.   magnesium  hydroxide 400 MG/5ML suspension Commonly known as: MILK OF MAGNESIA Take 30 mLs by mouth daily as needed for moderate constipation.   magnesium  oxide 400 (241.3 Mg) MG tablet Commonly known as: MAG-OX Take 1 tablet by mouth daily.   mupirocin  ointment 2 % Commonly known as: BACTROBAN  Apply 1 application topically daily. Qd to excision site   omeprazole 20 MG capsule Commonly known as: PRILOSEC Take 20 mg by mouth daily.   phosphorus 155-852-130 MG tablet Commonly known as: K PHOS NEUTRAL Take by mouth 4 (four) times daily.   sertraline 50 MG tablet Commonly known as: ZOLOFT Take 50 mg by mouth daily.   thiamine  50 MG tablet Commonly known as: VITAMIN B-1 Take 50 mg by mouth daily.   timolol  0.5 % ophthalmic solution Commonly known as: TIMOPTIC  Place 1 drop into both eyes 2 (two) times daily.   Vitamin D3 50 MCG (2000 UT) capsule Take by mouth.               Durable Medical Equipment  (From admission, onward)           Start     Ordered   10/16/24 1002  For home use only DME oxygen   Once       Question Answer Comment  Length of Need 6 Months   Mode or (Route) Nasal cannula   Liters per Minute 3   Frequency Continuous (stationary and portable oxygen unit needed)   Oxygen delivery system: Gas   Oxygen delivery system: Portable concentrator (POC)      10/16/24 1001           Allergies[1]  Follow-up Information     Brown, Fallon E, NP Follow up in 4 week(s).   Specialty: Vascular Surgery Why: In 4-5 weeks with JD/Fb with LLE DVT study Contact information: [address] [place] [address] [phone]         your primary care provider at your assisted living facility Follow up.  The results of significant diagnostics from this hospitalization (including imaging, microbiology, ancillary and laboratory) are listed below for reference.    Significant Diagnostic Studies: CT Angio Chest Pulmonary Embolism (PE) W or WO Contrast Result Date: 10/15/2024 EXAM: CTA of the Chest with contrast for PE 10/15/2024 04:03:05 PM TECHNIQUE: CTA of the chest was performed after the administration of 75 mL of intravenous contrast (iohexol  (OMNIPAQUE ) 350 MG/ML injection). Multiplanar reformatted images are provided for review. MIP images are provided for review. Automated exposure control, iterative reconstruction, and/or weight based adjustment of the mA/kV was utilized to reduce the radiation dose to as low as reasonably achievable. COMPARISON: Chest radiograph 10/15/2024. CLINICAL HISTORY: Pulmonary embolism (PE) suspected, high probability. Shortness of breath. Diagnosed with DVT in the left leg yesterday. FINDINGS: PULMONARY ARTERIES: Pulmonary arteries are adequately opacified for evaluation. Large filling defect demonstrated in the distal right main pulmonary artery, extending into multiple right upper lobe and lower lobe branches. This is consistent with acute pulmonary embolus. Left pulmonary arteries are patent. Clot burden is  moderate. Main pulmonary artery is normal in caliber. MEDIASTINUM: The heart demonstrates an IV to LV ratio of 1.09, which may indicate right heart strain. Small pericardial effusion. The thoracic aorta is normal in caliber with calcification. Calcification of the coronary arteries. LYMPH NODES: No mediastinal, hilar or axillary lymphadenopathy. LUNGS AND PLEURA: Emphysematous changes throughout the lungs. Prominent apical and upper lobe scarring on the right. Traction bronchiectasis. Small bilateral pleural effusions. No pneumothorax. No focal consolidation or pulmonary edema. UPPER ABDOMEN: 2.2 cm diameter left adrenal gland nodule with density measurement of 1.9 Hounsfield unit suggesting benign fat-containing adenoma. No additional follow-up is indicated. Midline ventral abdominal wall hernia containing fat. SOFT TISSUES AND BONES: Degenerative changes in the spine. No acute bony abnormalities. NOTE CRITICAL RESULT: Critical results/urgent findings were called at 4:14 PM on 10/15/2024 by radiologist W. Okey Gravely, MD to Dr. Dorothyann. IMPRESSION: 1. Acute pulmonary embolus in the distal right main pulmonary artery, extending into multiple right upper lobe and lower lobe branches, with moderate clot burden. 2. IV to LV ratio of 1.09, suggestive of right heart strain. 3. Small bilateral pleural effusions. 4. Small pericardial effusion. 5. Emphysematous changes throughout the lungs with prominent apical and upper lobe scarring on the right and traction bronchiectasis. Electronically signed by: Elsie Gravely MD 10/15/2024 04:19 PM EST RP Workstation: [id number]   DG Chest 2 View Result Date: 10/15/2024 CLINICAL DATA:  Shortness of breath. EXAM: CHEST - 2 VIEW COMPARISON:  04/02/2015 FINDINGS: The heart size and mediastinal contours are within normal limits. Stable chronic emphysematous lung disease with more prominent cystic changes and scarring in the right upper lung. Previous mass-like opacity in the  right upper lung is no longer present. No edema, pneumothorax or pleural fluid identified. The visualized skeletal structures are unremarkable. IMPRESSION: Chronic emphysematous lung disease with more prominent cystic changes and scarring in the right upper lung. Previous mass-like opacity in the right upper lung is no longer present. Electronically Signed   By: Marcey Moan M.D.   On: 10/15/2024 12:30    Microbiology: No results found for this or any previous visit (from the past 240 hours).   Labs: Basic Metabolic Panel: Recent Labs  Lab 10/15/24 1303 10/16/24 0404  NA 138 136  K 4.1 3.8  CL 105 104  CO2 20* 24  GLUCOSE 88 94  BUN 16 13  CREATININE 1.34* 1.17  CALCIUM 8.7* 8.5*   Liver Function Tests: Recent Labs  Lab 10/15/24  1303 10/16/24 0404  AST 22 20  ALT 12 10  ALKPHOS 86 79  BILITOT 0.4 0.4  PROT 6.7 6.3*  ALBUMIN 4.1 3.7   No results for input(s): LIPASE, AMYLASE in the last 168 hours. No results for input(s): AMMONIA in the last 168 hours. CBC: Recent Labs  Lab 10/15/24 1303 10/16/24 0404  WBC 5.1 4.7  HGB 14.7 13.9  HCT 44.6 41.1  MCV 96.3 93.2  PLT 134* 125*   Cardiac Enzymes: No results for input(s): CKTOTAL, CKMB, CKMBINDEX, TROPONINI in the last 168 hours. BNP: BNP (last 3 results) No results for input(s): BNP in the last 8760 hours.  ProBNP (last 3 results) No results for input(s): PROBNP in the last 8760 hours.  CBG: No results for input(s): GLUCAP in the last 168 hours.     Signed:  Devaughn KATHEE Ban MD.  [facility] 10/16/2024, 2:17 PM     [1] No Known Allergies

## 2024-10-16 NOTE — Progress Notes (Signed)
" ° °  Brief Progress Note   _____________________________________________________________________________________________________________  Patient Name: Rody Keadle Patient DOB: 1946-05-29 Date: 10/16/2024     Data: Patient admitted with PE on 1/29 as Progressive LOC. BP 156/72, HR 90, 100% on R/A. Heparin  gtt has been D/C and patient placed on Eliquis . Trops trending down.     Action: Reached out to Dr Kandis to see if patient could be downgraded to [facility] or Med Surg.    Response:  Per Dr Kandis, patient may be able to DC later today, if not he will review for downgrade.   _____________________________________________________________________________________________________________  [facility] RN Expeditor Akiba Melfi Please contact us  directly via secure chat (search for [facility]) or by calling us  at [phone] [facility]).  "

## 2024-10-16 NOTE — Consult Note (Signed)
 PHARMACY - ANTICOAGULATION CONSULT NOTE  Pharmacy Consult for heparin  >> Eliquis  Indication: new PE  Allergies[1]  Patient Measurements: Height: 6' 1 (185.4 cm) Weight: 99.8 kg (220 lb) IBW/kg (Calculated) : 79.9 HEPARIN  DW (KG): 99.8  Vital Signs: Temp: 97.6 F (36.4 C) (01/30 0356) Temp Source: Oral (01/30 0356) BP: 143/78 (01/30 0600) Pulse Rate: 84 (01/30 0600)  Labs: Recent Labs    10/15/24 1303 10/15/24 1642 10/16/24 0310 10/16/24 0404  HGB 14.7  --   --  13.9  HCT 44.6  --   --  41.1  PLT 134*  --   --  125*  APTT  --  <22*  --   --   LABPROT  --  16.3*  --   --   INR  --  1.2  --   --   HEPARINUNFRC  --  <0.10* >1.10*  --   CREATININE 1.34*  --   --  1.17    Estimated Creatinine Clearance: 63.7 mL/min (by C-G formula based on SCr of 1.17 mg/dL).   Medical History: Past Medical History:  Diagnosis Date   GERD (gastroesophageal reflux disease)    Hyperlipidemia    Hypertension    Major depressive disorder     Medications:  Apixaban  5mg  BID - last dose 09/28/2024  Assessment: Nathan Thomas is a 53 yoM presenting with concerns for new pulmonary embolism. Past medical history is notable for history of DVT (2017, was put on apixaban  until 09/28/2024), HTN, and HLD.  Prior to ED arrival, patient was diagnosed with DVT in left leg yesterday (1/28). Patient had just been taken off apixaban  on 1/12, but was scheduled to resume apixaban  following DVT diagnosis on 1/28. Patient reports last dose of apixaban  on 1/12, as has not picked up new apixaban  prescription yet.  On arrival to ED, patient with complaints of shortness of breath, on room air. CT-PE demonstrating acute pulmonary embolus in the distal right main pulmonary artery, extending into multiple right upper lobe and lower lobe branches, with moderate clot burden. ECHO has been ordered. Vascular surgery planning for possible thrombectomy tomorrow (1/30) morning.  Baseline hgb 14.7, plt 134 (no recent  baseline available).  Goal of Therapy:  Heparin  level 0.3-0.7 units/ml Monitor platelets by anticoagulation protocol: Yes   Plan:  Stop heparin  infusion Start apixaban  10 mg twice daily x 7 days followed by apixaban  5 mg twice daily  Elspeth Sour, PharmD Clinical Pharmacist 10/16/2024 9:24 AM        [1] No Known Allergies

## 2024-10-16 NOTE — ED Notes (Signed)
 This tech walked this pt with continous o2  sats dropped to 80 while walking pt stated no SOB
